# Patient Record
Sex: Female | Born: 1965 | Race: White | Hispanic: No | Marital: Married | Smoking: Never smoker
Health system: Southern US, Community
[De-identification: ages and names within clinical notes are randomized; demographics above are authoritative.]

## PROBLEM LIST (undated history)

## (undated) DIAGNOSIS — T753XXA Motion sickness, initial encounter: Secondary | ICD-10-CM

## (undated) DIAGNOSIS — C801 Malignant (primary) neoplasm, unspecified: Secondary | ICD-10-CM

## (undated) DIAGNOSIS — Z9109 Other allergy status, other than to drugs and biological substances: Secondary | ICD-10-CM

## (undated) DIAGNOSIS — Z973 Presence of spectacles and contact lenses: Secondary | ICD-10-CM

## (undated) HISTORY — PX: COLONOSCOPY: SHX174

---

## 2006-03-01 ENCOUNTER — Inpatient Hospital Stay (HOSPITAL_COMMUNITY): Admission: AD | Admit: 2006-03-01 | Discharge: 2006-03-01 | Payer: Self-pay | Admitting: Obstetrics & Gynecology

## 2006-03-01 ENCOUNTER — Ambulatory Visit: Payer: Self-pay | Admitting: Family Medicine

## 2006-10-22 ENCOUNTER — Ambulatory Visit: Payer: Self-pay | Admitting: Gynecology

## 2006-10-22 ENCOUNTER — Encounter (INDEPENDENT_AMBULATORY_CARE_PROVIDER_SITE_OTHER): Payer: Self-pay | Admitting: Gynecology

## 2007-10-28 ENCOUNTER — Ambulatory Visit: Payer: Self-pay | Admitting: Gynecology

## 2007-10-28 ENCOUNTER — Encounter: Payer: Self-pay | Admitting: Obstetrics & Gynecology

## 2007-10-28 ENCOUNTER — Encounter (INDEPENDENT_AMBULATORY_CARE_PROVIDER_SITE_OTHER): Payer: Self-pay | Admitting: Gynecology

## 2007-10-30 ENCOUNTER — Encounter: Admission: RE | Admit: 2007-10-30 | Discharge: 2007-10-30 | Payer: Self-pay | Admitting: Obstetrics & Gynecology

## 2008-11-04 ENCOUNTER — Ambulatory Visit: Payer: Self-pay | Admitting: Family Medicine

## 2008-11-04 ENCOUNTER — Encounter: Payer: Self-pay | Admitting: Family Medicine

## 2008-11-04 LAB — CONVERTED CEMR LAB

## 2009-11-09 ENCOUNTER — Ambulatory Visit: Payer: Self-pay | Admitting: Obstetrics & Gynecology

## 2009-11-09 LAB — CONVERTED CEMR LAB
HCV Ab: NEGATIVE
Hepatitis B Surface Ag: NEGATIVE

## 2010-01-21 ENCOUNTER — Ambulatory Visit: Payer: Self-pay | Admitting: Gastroenterology

## 2010-11-09 ENCOUNTER — Ambulatory Visit: Payer: Self-pay | Admitting: Obstetrics & Gynecology

## 2010-11-28 ENCOUNTER — Ambulatory Visit: Payer: Self-pay | Admitting: Obstetrics and Gynecology

## 2010-11-28 ENCOUNTER — Encounter: Payer: Self-pay | Admitting: Obstetrics and Gynecology

## 2010-11-28 LAB — CONVERTED CEMR LAB
Antibody Screen: NEGATIVE
Basophils Absolute: 0 10*3/uL (ref 0.0–0.1)
Basophils Relative: 0 % (ref 0–1)
Eosinophils Absolute: 0.1 10*3/uL (ref 0.0–0.7)
Hemoglobin: 14.3 g/dL (ref 12.0–15.0)
Hepatitis B Surface Ag: NEGATIVE
Neutro Abs: 4.6 10*3/uL (ref 1.7–7.7)
Neutrophils Relative %: 75 % (ref 43–77)
Rh Type: POSITIVE
Rubella: 35.9 intl units/mL — ABNORMAL HIGH

## 2010-12-06 ENCOUNTER — Ambulatory Visit (HOSPITAL_COMMUNITY)
Admission: RE | Admit: 2010-12-06 | Discharge: 2010-12-06 | Payer: Self-pay | Source: Home / Self Care | Attending: Family Medicine | Admitting: Family Medicine

## 2010-12-06 ENCOUNTER — Encounter: Payer: Self-pay | Admitting: Family Medicine

## 2010-12-13 ENCOUNTER — Encounter: Payer: Self-pay | Admitting: Family Medicine

## 2010-12-13 ENCOUNTER — Ambulatory Visit (HOSPITAL_COMMUNITY)
Admission: RE | Admit: 2010-12-13 | Discharge: 2010-12-13 | Payer: Self-pay | Source: Home / Self Care | Attending: Family Medicine | Admitting: Family Medicine

## 2010-12-20 ENCOUNTER — Encounter: Payer: Self-pay | Admitting: Family Medicine

## 2010-12-20 ENCOUNTER — Ambulatory Visit
Admission: RE | Admit: 2010-12-20 | Discharge: 2010-12-20 | Payer: Self-pay | Source: Home / Self Care | Attending: Family Medicine | Admitting: Family Medicine

## 2010-12-20 LAB — CONVERTED CEMR LAB: GC Probe Amp, Genital: NEGATIVE

## 2010-12-21 ENCOUNTER — Encounter: Payer: Self-pay | Admitting: Family Medicine

## 2010-12-21 LAB — CONVERTED CEMR LAB

## 2010-12-31 ENCOUNTER — Other Ambulatory Visit: Payer: Self-pay | Admitting: Family Medicine

## 2010-12-31 DIAGNOSIS — O09529 Supervision of elderly multigravida, unspecified trimester: Secondary | ICD-10-CM

## 2010-12-31 DIAGNOSIS — Z3689 Encounter for other specified antenatal screening: Secondary | ICD-10-CM

## 2011-01-09 ENCOUNTER — Ambulatory Visit (HOSPITAL_COMMUNITY)
Admission: RE | Admit: 2011-01-09 | Discharge: 2011-01-09 | Payer: Self-pay | Source: Home / Self Care | Attending: Family Medicine | Admitting: Family Medicine

## 2011-01-17 ENCOUNTER — Encounter: Payer: Self-pay | Admitting: Obstetrics and Gynecology

## 2011-01-17 DIAGNOSIS — Z3480 Encounter for supervision of other normal pregnancy, unspecified trimester: Secondary | ICD-10-CM

## 2011-02-07 ENCOUNTER — Ambulatory Visit (HOSPITAL_COMMUNITY)
Admission: RE | Admit: 2011-02-07 | Discharge: 2011-02-07 | Disposition: A | Discharge status: Home / Self Care | Payer: BC Managed Care – PPO | Source: Ambulatory Visit | Attending: Family Medicine | Admitting: Family Medicine

## 2011-02-07 DIAGNOSIS — Z1231 Encounter for screening mammogram for malignant neoplasm of breast: Secondary | ICD-10-CM | POA: Insufficient documentation

## 2011-02-15 ENCOUNTER — Encounter: Payer: BC Managed Care – PPO | Admitting: Obstetrics & Gynecology

## 2011-02-15 DIAGNOSIS — Z3480 Encounter for supervision of other normal pregnancy, unspecified trimester: Secondary | ICD-10-CM

## 2011-02-15 DIAGNOSIS — O09529 Supervision of elderly multigravida, unspecified trimester: Secondary | ICD-10-CM

## 2011-02-28 ENCOUNTER — Ambulatory Visit (HOSPITAL_COMMUNITY)
Admission: RE | Admit: 2011-02-28 | Discharge: 2011-02-28 | Disposition: A | Discharge status: Home / Self Care | Payer: BC Managed Care – PPO | Source: Ambulatory Visit | Attending: Family Medicine | Admitting: Family Medicine

## 2011-02-28 DIAGNOSIS — Z3689 Encounter for other specified antenatal screening: Secondary | ICD-10-CM

## 2011-02-28 DIAGNOSIS — O09529 Supervision of elderly multigravida, unspecified trimester: Secondary | ICD-10-CM

## 2011-02-28 DIAGNOSIS — Z1389 Encounter for screening for other disorder: Secondary | ICD-10-CM | POA: Insufficient documentation

## 2011-02-28 DIAGNOSIS — O358XX0 Maternal care for other (suspected) fetal abnormality and damage, not applicable or unspecified: Secondary | ICD-10-CM | POA: Insufficient documentation

## 2011-02-28 DIAGNOSIS — Z363 Encounter for antenatal screening for malformations: Secondary | ICD-10-CM | POA: Insufficient documentation

## 2011-03-01 ENCOUNTER — Other Ambulatory Visit: Payer: Self-pay | Admitting: Family Medicine

## 2011-03-01 DIAGNOSIS — Z0489 Encounter for examination and observation for other specified reasons: Secondary | ICD-10-CM

## 2011-03-14 ENCOUNTER — Encounter: Payer: BC Managed Care – PPO | Admitting: Obstetrics and Gynecology

## 2011-03-14 DIAGNOSIS — Z3480 Encounter for supervision of other normal pregnancy, unspecified trimester: Secondary | ICD-10-CM

## 2011-04-04 ENCOUNTER — Ambulatory Visit (HOSPITAL_COMMUNITY): Payer: BC Managed Care – PPO

## 2011-04-05 ENCOUNTER — Ambulatory Visit (HOSPITAL_COMMUNITY)
Admission: RE | Admit: 2011-04-05 | Discharge: 2011-04-05 | Disposition: A | Discharge status: Home / Self Care | Payer: BC Managed Care – PPO | Source: Ambulatory Visit | Attending: Family Medicine | Admitting: Family Medicine

## 2011-04-05 DIAGNOSIS — Z0489 Encounter for examination and observation for other specified reasons: Secondary | ICD-10-CM

## 2011-04-05 DIAGNOSIS — O09529 Supervision of elderly multigravida, unspecified trimester: Secondary | ICD-10-CM | POA: Insufficient documentation

## 2011-04-05 DIAGNOSIS — Z3689 Encounter for other specified antenatal screening: Secondary | ICD-10-CM | POA: Insufficient documentation

## 2011-04-12 ENCOUNTER — Encounter (INDEPENDENT_AMBULATORY_CARE_PROVIDER_SITE_OTHER): Payer: BC Managed Care – PPO | Admitting: Obstetrics & Gynecology

## 2011-04-12 DIAGNOSIS — O09529 Supervision of elderly multigravida, unspecified trimester: Secondary | ICD-10-CM

## 2011-04-12 DIAGNOSIS — Z3480 Encounter for supervision of other normal pregnancy, unspecified trimester: Secondary | ICD-10-CM

## 2011-04-25 ENCOUNTER — Inpatient Hospital Stay (HOSPITAL_COMMUNITY)
Admission: AD | Admit: 2011-04-25 | Discharge: 2011-04-25 | Disposition: A | Discharge status: Home / Self Care | Payer: BC Managed Care – PPO | Source: Ambulatory Visit | Attending: Obstetrics & Gynecology | Admitting: Obstetrics & Gynecology

## 2011-04-25 DIAGNOSIS — O212 Late vomiting of pregnancy: Secondary | ICD-10-CM

## 2011-04-25 DIAGNOSIS — K5289 Other specified noninfective gastroenteritis and colitis: Secondary | ICD-10-CM | POA: Insufficient documentation

## 2011-04-25 DIAGNOSIS — O99891 Other specified diseases and conditions complicating pregnancy: Secondary | ICD-10-CM

## 2011-04-25 DIAGNOSIS — O9989 Other specified diseases and conditions complicating pregnancy, childbirth and the puerperium: Secondary | ICD-10-CM

## 2011-04-25 LAB — URINALYSIS, ROUTINE W REFLEX MICROSCOPIC
Bilirubin Urine: NEGATIVE
Glucose, UA: NEGATIVE mg/dL
Ketones, ur: 15 mg/dL — AB
Leukocytes, UA: NEGATIVE
Nitrite: NEGATIVE
Urobilinogen, UA: 0.2 mg/dL (ref 0.0–1.0)
pH: 6 (ref 5.0–8.0)

## 2011-04-25 LAB — URINE MICROSCOPIC-ADD ON

## 2011-04-25 NOTE — Assessment & Plan Note (Signed)
NAME:  Rebecca Roman, Rebecca Roman NO.:  [id number]   MEDICAL RECORD NO.:  [id number]          PATIENT TYPE:  POB   LOCATION:  [facility] at [facility]         FACILITY:  [facility]   PHYSICIAN:  Tinnie Gens, MD        DATE OF BIRTH:  [birth year]   DATE OF SERVICE:  11/04/2008                                  CLINIC NOTE   CHIEF COMPLAINT:  Physical exam.   HISTORY OF PRESENT ILLNESS:  The patient is a 45 year old gravida 1,  para 1, who comes in today for annual exam.  She uses a Nuvaring.  She  has been using it sort of sporadically over the last year as she broke  up with her partner.  She did find out he had been seeing someone else  and would like a complete STD checked.  She had no significant vaginal  discharge, fevers, chills, nausea, or vomiting.   PAST MEDICAL HISTORY:  Seasonal allergies and history of kidney stones.   PAST SURGICAL HISTORY:  Negative.   MEDICATIONS:  She is on Flonase as needed, Nuvaring, Allegra D daily,  multivitamin, ibuprofen as needed, fish oil twice a day, and Singulair  daily.   ALLERGIES:  No known drug allergies.   FAMILY HISTORY:  Colon cancer in the paternal grandfather, who died at  33.  Father's sister died of leukemia.  There is a history of  hypertension.   SOCIAL HISTORY:  She is a social alcohol user.  No drugs or tobacco.  She works for [employer].   OBSTETRICAL HISTORY:  She is a G1, P1, and 1 vaginal delivery 13 years  ago.   GYNECOLOGICAL HISTORY:  Regular cycles with no difficulty.   REVIEW OF SYSTEMS:  A 14-point review of systems reviewed.  She denies  headache, chest pain, shortness of breath, nausea, vomiting, diarrhea,  constipation, dysuria, breast tenderness or abnormality.   PHYSICAL EXAMINATION:  GENERAL:  She is a well-developed, well-nourished  female in no acute distress.  VITAL SIGNS:  As noted in the chart, her weight is down to 133.  HEENT:  Normocephalic and atraumatic.  Sclerae anicteric.  NECK:   Supple.  Normal thyroid.  LUNGS:  Clear bilaterally.  CV:  Regular rate and rhythm. No rubs, gallops, or murmurs.  ABDOMEN:  Soft, nontender, and nondistended.  BREASTS:  Symmetric with everted nipples.  She has some fibrocystic  change in the outer lower quadrant bilaterally with no dominant masses.  No supraclavicular or axillary adenopathy.  EXTREMITIES:  No cyanosis, clubbing, or edema.  Distal pulses are 2+.  GU:  Normal external female genitalia.  BUS normal.  Vagina is pink and  rugated.  Cervix is without lesion.  Uterus looks small, anteverted.  No  adnexal mass or tenderness.   IMPRESSION:  1. Yearly exam  2. Sexually transmitted disease screen.   PLAN:  1. Pap smear today.  2. Schedule her mammogram today as well.  3. GC, chlamydia, RPR, and HIV today.  4. The patient had a health screen at work, show a total cholesterol      of 170, triglycerides of 72, and her HDL over 70s.  ______________________________  Tinnie Gens, MD     TP/MEDQ  D:  11/04/2008  T:  11/05/2008  Job:  [id number]

## 2011-04-25 NOTE — Assessment & Plan Note (Signed)
NAME:  Rebecca Roman, Rebecca Roman NO.:  [id number]   MEDICAL RECORD NO.:  [id number]          PATIENT TYPE:  POB   LOCATION:  [facility] at [facility]         FACILITY:  [facility]   PHYSICIAN:  Jaynie Collins, MD     DATE OF BIRTH:  [birth year]   DATE OF SERVICE:  11/09/2009                                  CLINIC NOTE   CHIEF COMPLAINT:  Annual examination.   HISTORY OF PRESENT ILLNESS:  The patient is a 45 year old gravida 1,  para 1 who comes in today for annual examination.  The patient denies  any gynecologic symptoms or any other systemic symptoms.  She is  scheduled for her annual mammogram later today.  The patient is also  desiring a complete FTI check.   PAST OB/GYN HISTORY:  Gravida 1, para 1, 1 vaginal delivery 14 years  ago.  She has regular cycles and no intermenstrual bleeding.  She denies  any history of abnormal Pap smears and her last Pap smear was on  November 04, 2008, which was negative.  She denies any history of  sexually transmitted infections.   PAST MEDICAL HISTORY:  Seasonal allergies and kidney stones.   PAST SURGICAL HISTORY:  None.   MEDICATIONS:  Flonase as needed, Allegra D daily, multivitamin as  needed, ibuprofen as needed, fish oil twice a day, and Singulair daily.   ALLERGIES:  No known drug allergies.   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  The patient lives with her daughter who is  currently a Printmaker in high school.  She is a social alcohol user.  No  illicit drugs or tobacco and denies any current or past history of  sexual or physical abuse.   FAMILY HISTORY:  Notable for colon cancer in the paternal grandfather  who died at age 38 and also her paternal aunt died of leukemia.  There  is a family history of hypertension.  No gynecologic cancers.   REVIEW OF SYSTEMS:  A 14 point review of systems was reviewed  and the  patient denied any other symptoms.   PHYSICAL EXAMINATION:  VITAL SIGNS:  Pulse 95, blood pressure 133/85,  weight 141 pounds.  GENERAL:  In no apparent distress.  HEENT:  Normocephalic and atraumatic.  NECK:  Supple.  Normal thyroid.  LUNGS:  Clear to auscultation bilaterally.  HEART:  Regular rate and rhythm.  BREASTS:  Symmetric with everted nipples.  No abnormal masses, skin  changes, lymphadenopathy, or nipple drainage noted.  ABDOMEN:  Soft, nontender, nondistended.  EXTREMITIES:  No cyanosis, clubbing, or edema.  PELVIC:  Normal external female genitalia.  Pink well rugated vagina.  Normal cervical contour.  The Pap smear was obtained.  Uterus is small  and anteverted.  No abnormal adnexal masses.  No tenderness on bimanual  examination.  Rectum is within normal limits on external examination.   ASSESSMENT AND PLAN:  The patient is a 45 year old gravida 1, para 1  here for annual examination.  The patient did have a normal breast  examination today.  We  will follow up on the results of her mammogram.  Pap smear was also done today.  We will run a reflex GC and chlamydia as  part of her STD screening and do serum testing for HIV, hepatitis B,  hepatitis C, RPR today.  The patient does have a history of doing a  health screen at work to check her lipid panel, which she said was  normal.  She was told to come back for any further gynecologic concerns.           ______________________________  Jaynie Collins, MD     UA/MEDQ  D:  11/09/2009  T:  11/10/2009  Job:  [id number]

## 2011-04-25 NOTE — Assessment & Plan Note (Signed)
NAME:  Rebecca, Roman NO.:  [id number]   MEDICAL RECORD NO.:  [id number]          PATIENT TYPE:  POB   LOCATION:  [facility] at [facility]         FACILITY:  [facility]   PHYSICIAN:  Allie Bossier, MD        DATE OF BIRTH:  [birth year]   DATE OF SERVICE:                                  CLINIC NOTE   Ms. Rebecca Roman is a 45 year old, divorced, white, gravida 1, para 101 with a  89 year old daughter who comes here for her annual exam.  She has no  complaints.  She is happy with Nuva Ring.   PAST MEDICAL HISTORY:  Seasonal allergies and history of kidney stone in  March 2007.   PAST SURGICAL HISTORY:  None.   FAMILY HISTORY:  Significant for colon cancer in a paternal grandfather  who died at 10 years of age.  There is no breast or GYN malignancies.   SOCIAL HISTORY:  Positive for social alcohol but she denies tobacco or  drug use.  No latex allergies.  No allergies to medicines.   REVIEW OF SYSTEMS:  Monogamous for more than 3 years.  No dyspareunia.  She works as an Scientist, water quality at [employer].   PHYSICAL EXAMINATION:  VITAL SIGNS:  Weight 141 pounds, blood pressure  133/87, pulse 87, height 5 feet 9.  HEENT:  Normal.  HEART:  Regular rate and rhythm.  LUNGS:  Clear to auscultation bilaterally.  ABDOMEN:  Benign.  BREASTS:  Normal.  EXTERNAL GENITALIA:  Shaved.  Normal.  No lesions.  Normal discharge.  Normal cervix.  Uterus normal size and shape, anteverted, mobile,  nontender.  Adnexa not enlarged.  No masses.   ASSESSMENT/PLAN:  Annual exam.  She currently has a refill on her Nuva  Ring.  I have given her information on Mirena.  Checked a Pap smear with  cervical cultures and recommended to schedule a mammogram.  Also  recommended self-breast exams, self vulvar exams monthly.  She has no  questions.      Allie Bossier, MD     MCD/MEDQ  D:  10/28/2007  T:  10/29/2007  Job:  [id number]

## 2011-04-25 NOTE — Assessment & Plan Note (Signed)
NAME:  Rebecca Roman, COVAL NO.:  [id number]   MEDICAL RECORD NO.:  [id number]          PATIENT TYPE:  POB   LOCATION:  [facility] at [facility]         FACILITY:  [facility]   PHYSICIAN:  Allie Bossier, MD        DATE OF BIRTH:  12-Oct-1966   DATE OF SERVICE:  11/09/2010                                  CLINIC NOTE   Ms. Rebecca Roman is a 45 year old divorced white G1, P1.  She has a 15-year-  old daughter.  She comes in here for annual exam.  She has no particular  complaints.  She does mention during a review of systems that she has  been having unprotected intercourse.  She has been with her current  partner for the last year and a half.  He has no children and she would  like to conceive.  She does have a period once a month and thinks she is  ovulating.   PAST MEDICAL HISTORY:  Seasonal allergies, history of kidney stone in  March 2007.   PAST SURGICAL HISTORY:  None.   MEDICATIONS:  She takes Singulair daily, fish oil, Allegra-D, and  Flonase on a p.r.n. basis.   SOCIAL HISTORY:  She drinks alcohol socially.  Denies tobacco or drug  use.   REVIEW OF SYSTEMS:  She denies dyspareunia.  She [facility] to the [facility] for her regular medical care.  Her mammogram is today.  She had a  colonoscopy in February of this year and it was normal except that she  was told she has internal hemorrhoids.  The remainder of review of  systems questions are negative.   FAMILY HISTORY:  Positive for colon cancer in a paternal grandfather who  died at age 79.  She denies family history of GYN or breast cancer.   PHYSICAL EXAMINATION:  GENERAL:  Well-nourished, well-hydrated pleasant  white female, height 5 feet 9 inches, weight 146 pounds, blood pressure  120/80, pulse 81.  HEENT:  Normal.  BREASTS:  Normal bilaterally.  HEART:  Regular rate and rhythm.  LUNGS:  Clear to auscultation bilaterally.  ABDOMEN:  Scaphoid, benign.  No palpable hepatosplenomegaly.  EXTERNAL GENITALIA:  No  lesions, shaved.  Cervix parous.  Normal  discharge.  Uterus, normal size and shape.  Anteverted mobile.  Adnexa  nontender.  No masses.   ASSESSMENT AND PLAN:  Annual exam.  I have checked the Pap smear.  Recommended self-breast and self-vulvar exams.  Her mammogram will be  later today.  With regard to her desire for conception, I have recommend  she start on multivitamin with folic acid daily.  I have also told her  that should she wish to explore why she has not  conceived yet, then her boyfriend will need to the urologist for semen  analysis and she should start ovulation prediction kit.      Allie Bossier, MD     MCD/MEDQ  D:  11/09/2010  T:  11/09/2010  Job:  [id number]

## 2011-05-02 ENCOUNTER — Encounter (INDEPENDENT_AMBULATORY_CARE_PROVIDER_SITE_OTHER): Payer: BC Managed Care – PPO | Admitting: Obstetrics & Gynecology

## 2011-05-02 DIAGNOSIS — O09529 Supervision of elderly multigravida, unspecified trimester: Secondary | ICD-10-CM

## 2011-05-02 DIAGNOSIS — Z3480 Encounter for supervision of other normal pregnancy, unspecified trimester: Secondary | ICD-10-CM

## 2011-05-05 ENCOUNTER — Other Ambulatory Visit: Payer: BC Managed Care – PPO

## 2011-05-24 ENCOUNTER — Encounter (INDEPENDENT_AMBULATORY_CARE_PROVIDER_SITE_OTHER): Payer: BC Managed Care – PPO | Admitting: Obstetrics and Gynecology

## 2011-05-24 DIAGNOSIS — Z3480 Encounter for supervision of other normal pregnancy, unspecified trimester: Secondary | ICD-10-CM

## 2011-06-07 ENCOUNTER — Encounter (INDEPENDENT_AMBULATORY_CARE_PROVIDER_SITE_OTHER): Payer: BC Managed Care – PPO | Admitting: Obstetrics & Gynecology

## 2011-06-07 DIAGNOSIS — Z3480 Encounter for supervision of other normal pregnancy, unspecified trimester: Secondary | ICD-10-CM

## 2011-06-07 DIAGNOSIS — O09529 Supervision of elderly multigravida, unspecified trimester: Secondary | ICD-10-CM

## 2011-06-20 ENCOUNTER — Encounter: Payer: Self-pay | Admitting: Obstetrics & Gynecology

## 2011-06-20 ENCOUNTER — Ambulatory Visit (INDEPENDENT_AMBULATORY_CARE_PROVIDER_SITE_OTHER): Payer: BC Managed Care – PPO | Admitting: Obstetrics & Gynecology

## 2011-06-20 VITALS — BP 135/83 | Wt 183.0 lb

## 2011-06-20 DIAGNOSIS — O09529 Supervision of elderly multigravida, unspecified trimester: Secondary | ICD-10-CM

## 2011-06-20 DIAGNOSIS — Z3480 Encounter for supervision of other normal pregnancy, unspecified trimester: Secondary | ICD-10-CM

## 2011-06-29 ENCOUNTER — Other Ambulatory Visit: Payer: Self-pay | Admitting: Obstetrics & Gynecology

## 2011-06-29 ENCOUNTER — Encounter (INDEPENDENT_AMBULATORY_CARE_PROVIDER_SITE_OTHER): Payer: BC Managed Care – PPO | Admitting: Obstetrics & Gynecology

## 2011-06-29 DIAGNOSIS — Z3480 Encounter for supervision of other normal pregnancy, unspecified trimester: Secondary | ICD-10-CM

## 2011-06-30 LAB — GC/CHLAMYDIA PROBE AMP, GENITAL: Chlamydia, DNA Probe: NEGATIVE

## 2011-07-13 ENCOUNTER — Encounter (INDEPENDENT_AMBULATORY_CARE_PROVIDER_SITE_OTHER): Payer: BC Managed Care – PPO | Admitting: Obstetrics & Gynecology

## 2011-07-13 DIAGNOSIS — O321XX0 Maternal care for breech presentation, not applicable or unspecified: Secondary | ICD-10-CM

## 2011-07-13 DIAGNOSIS — Z3480 Encounter for supervision of other normal pregnancy, unspecified trimester: Secondary | ICD-10-CM

## 2011-07-13 DIAGNOSIS — O09529 Supervision of elderly multigravida, unspecified trimester: Secondary | ICD-10-CM

## 2011-07-18 ENCOUNTER — Encounter (HOSPITAL_COMMUNITY): Payer: Self-pay

## 2011-07-18 ENCOUNTER — Observation Stay (HOSPITAL_COMMUNITY)
Admission: RE | Admit: 2011-07-18 | Discharge: 2011-07-18 | Disposition: A | Discharge status: Home / Self Care | Payer: BC Managed Care – PPO | Source: Ambulatory Visit | Attending: Family Medicine | Admitting: Family Medicine

## 2011-07-18 DIAGNOSIS — O321XX0 Maternal care for breech presentation, not applicable or unspecified: Principal | ICD-10-CM | POA: Insufficient documentation

## 2011-07-18 HISTORY — DX: Other allergy status, other than to drugs and biological substances: Z91.09

## 2011-07-18 MED ORDER — TERBUTALINE SULFATE 1 MG/ML IJ SOLN
0.2500 mg | Freq: Once | INTRAMUSCULAR | Status: AC
  Administered 2011-07-18: 0.25 mg via SUBCUTANEOUS

## 2011-07-18 NOTE — H&P (Signed)
Rebecca Roman is a 45 y.o. female presenting for breech and external version. Maternal Medical History:  Reason for admission: Reason for Admission:   nauseaFetal activity: Perceived fetal activity is normal.    Prenatal complications: HIV.   No bleeding.     OB History    Grav Para Term Preterm Abortions TAB SAB Ect Mult Living   2 1 1       1      Past Medical History  Diagnosis Date  . Environmental allergies    Past Surgical History  Procedure Date  . No past surgeries    Family History: family history is not on file. Social History:  reports that she has never smoked. She has never used smokeless tobacco. She reports that she does not drink alcohol or use illicit drugs.  Review of Systems  Constitutional: Negative for fever and chills.  Respiratory: Negative for cough, shortness of breath and wheezing.   Cardiovascular: Negative for chest pain.  Gastrointestinal: Negative for nausea, vomiting and abdominal pain.  Genitourinary: Negative for dysuria.  Musculoskeletal: Negative for myalgias and back pain.  Neurological: Negative for dizziness and tingling.  Psychiatric/Behavioral: Negative for depression.      Last menstrual period 10/22/2010. Maternal Exam:  Uterine Assessment: Contraction strength is mild.  Contraction frequency is irregular.   Abdomen: Patient reports no abdominal tenderness. Fundal height is size equals dates.   Fetal presentation: breech  Introitus: Normal vulva.   Physical Exam  Constitutional: She is oriented to person, place, and time. She appears well-developed and well-nourished.  HENT:  Head: Normocephalic and atraumatic.  Eyes: Pupils are equal, round, and reactive to light.  Neck: Normal range of motion.  Cardiovascular: Normal rate.   Respiratory: Effort normal.  GI: Soft.       gravid  Musculoskeletal: Normal range of motion.  Neurological: She is alert and oriented to person, place, and time.  Skin: Skin is warm and dry.    Psychiatric: She has a normal mood and affect.    Prenatal labs: ABO, Rh:  O pos Antibody: NEG (12/19 2149) Rubella:  Immune RPR: NON REAC (12/19 2149)  HBsAg: NEGATIVE (12/19 2149)  HIV: NON REACTIVE (12/19 2149)  GBS: NEGATIVE (07/19 1151)   Assessment/Plan: Breech presentaion For ECV Risks, benefits discussed. Thayer Inabinet S 07/18/2011, 9:03 AM

## 2011-07-18 NOTE — Procedures (Signed)
After informed verbal consent, Terbutaline 0.25 mg SQ given, ECV was attempted under Ultrasound guidance x 3.  Forward roll x 2 and backward roll x 1.  There was a small amount of movement, but ECV was unsuccessful. FHR was reactive before and after the procedure.   Pt. Tolerated the procedure well.

## 2011-07-19 ENCOUNTER — Encounter (INDEPENDENT_AMBULATORY_CARE_PROVIDER_SITE_OTHER): Payer: BC Managed Care – PPO | Admitting: Family Medicine

## 2011-07-19 DIAGNOSIS — Z3480 Encounter for supervision of other normal pregnancy, unspecified trimester: Secondary | ICD-10-CM

## 2011-07-19 NOTE — Discharge Summary (Signed)
Obstetric Discharge Summary Reason for Admission: breech Prenatal Procedures: ECV Intrapartum Procedures: none Postpartum Procedures: none Complications-Operative and Postpartum: none Hemoglobin  Date Value Range Status  11/28/2010 14.3  12.0-15.0 (g/dL) Final     HCT  Date Value Range Status  11/28/2010 44.2  36.0-46.0 (%) Final    Discharge Diagnoses: Breech pregnancy-failed version  Discharge Information: Date: 07/19/2011 Activity: unrestricted Diet: routine Medications: resume home meds Condition: stable Instructions: accupuncture, music, lights, position to try to get baby to turn to head down. Discharge to: home Follow-up Information    Follow up with Oceanna Arruda S, MD. Call in 1 day. (Keep your regularly scheduled appointment)    Contact information:   [address] [place] [address] [phone]           Ruchy Wildrick S 07/19/2011, 8:42 AM

## 2011-07-25 ENCOUNTER — Encounter (INDEPENDENT_AMBULATORY_CARE_PROVIDER_SITE_OTHER): Payer: BC Managed Care – PPO | Admitting: Obstetrics and Gynecology

## 2011-07-25 DIAGNOSIS — O09529 Supervision of elderly multigravida, unspecified trimester: Secondary | ICD-10-CM

## 2011-07-25 DIAGNOSIS — Z3480 Encounter for supervision of other normal pregnancy, unspecified trimester: Secondary | ICD-10-CM

## 2011-07-26 ENCOUNTER — Encounter (HOSPITAL_COMMUNITY): Payer: Self-pay

## 2011-07-26 ENCOUNTER — Telehealth: Payer: Self-pay

## 2011-07-26 ENCOUNTER — Encounter (HOSPITAL_COMMUNITY)
Admission: RE | Admit: 2011-07-26 | Discharge: 2011-07-26 | Disposition: A | Discharge status: Home / Self Care | Payer: BC Managed Care – PPO | Source: Ambulatory Visit | Attending: Obstetrics and Gynecology | Admitting: Obstetrics and Gynecology

## 2011-07-26 LAB — CBC
Hemoglobin: 12.6 g/dL (ref 12.0–15.0)
MCV: 95 fL (ref 78.0–100.0)
Platelets: 212 10*3/uL (ref 150–400)
RBC: 4.03 MIL/uL (ref 3.87–5.11)
WBC: 10.7 10*3/uL — ABNORMAL HIGH (ref 4.0–10.5)

## 2011-07-26 LAB — SURGICAL PCR SCREEN: Staphylococcus aureus: POSITIVE — AB

## 2011-07-26 LAB — RPR: RPR Ser Ql: NONREACTIVE

## 2011-07-26 NOTE — Patient Instructions (Signed)
20 Rebecca Roman  07/26/2011   Your procedure is scheduled on:  07/27/11  Report to [facility] at 1245 PM.  Call this number if you have problems the morning of surgery: [phone]   Remember:   Do not eat food:4 Hours before arrival.  Do not drink clear liquids: after 1015 am Thurs  Take these medicines the morning of surgery with A SIP OF WATER: none   Do not wear jewelry, make-up or nail polish.  Do not wear lotions, powders, or perfumes. You may wear deodorant.  Do not shave 48 hours prior to surgery.  Do not bring valuables to the hospital.  Contacts, dentures or bridgework may not be worn into surgery.  Leave suitcase in the car. After surgery it may be brought to your room.  For patients admitted to the hospital, checkout time is 11:00 AM the day of discharge.   Patients discharged the day of surgery will not be allowed to drive home.  Name and phone number of your driver: Caryn Bee- [phone]  Special Instructions: CHG Shower Use Special Wash: 1/2 bottle night before surgery and 1/2 bottle morning of surgery.   Please read over the following fact sheets that you were given:none

## 2011-07-26 NOTE — Telephone Encounter (Signed)
Hi Dr. Jolayne Panther,  I need you to call me as soon as you have a chance regarding this patient Rebecca Roman. You will be performing her C-section tomorrow afternoon and patient came in to our office today after her pre-op very upset b/c they told her she will not be able to have one of her daughters present in the delivery room and she is not happy about that. She was told to confirm with you if that would be able to happen. Patient is awaiting my call to let her know, she left pretty upset and needed to know that she would be able to have her daughter there. Please call me at [phone] that is my cell since I'm about to leave and I did tell patient I would call her today. Sorry to bother you about this but I didn't;t know what else to do, thanks!

## 2011-07-27 ENCOUNTER — Inpatient Hospital Stay (HOSPITAL_COMMUNITY): Payer: BC Managed Care – PPO | Admitting: Anesthesiology

## 2011-07-27 ENCOUNTER — Encounter (HOSPITAL_COMMUNITY): Payer: Self-pay | Admitting: *Deleted

## 2011-07-27 ENCOUNTER — Encounter (HOSPITAL_COMMUNITY)
Admission: RE | Disposition: A | Discharge status: Home / Self Care | Payer: Self-pay | Source: Ambulatory Visit | Attending: Obstetrics and Gynecology

## 2011-07-27 ENCOUNTER — Encounter (HOSPITAL_COMMUNITY): Payer: Self-pay | Admitting: Anesthesiology

## 2011-07-27 ENCOUNTER — Inpatient Hospital Stay (HOSPITAL_COMMUNITY)
Admission: RE | Admit: 2011-07-27 | Discharge: 2011-07-29 | DRG: 371 | Disposition: A | Discharge status: Home / Self Care | Payer: BC Managed Care – PPO | Source: Ambulatory Visit | Attending: Obstetrics and Gynecology | Admitting: Obstetrics and Gynecology

## 2011-07-27 DIAGNOSIS — Z3480 Encounter for supervision of other normal pregnancy, unspecified trimester: Secondary | ICD-10-CM

## 2011-07-27 DIAGNOSIS — Z01818 Encounter for other preprocedural examination: Secondary | ICD-10-CM

## 2011-07-27 DIAGNOSIS — O09529 Supervision of elderly multigravida, unspecified trimester: Secondary | ICD-10-CM

## 2011-07-27 DIAGNOSIS — O321XX0 Maternal care for breech presentation, not applicable or unspecified: Secondary | ICD-10-CM

## 2011-07-27 DIAGNOSIS — Z01812 Encounter for preprocedural laboratory examination: Secondary | ICD-10-CM

## 2011-07-27 LAB — TYPE AND SCREEN
ABO/RH(D): O POS
Antibody Screen: NEGATIVE

## 2011-07-27 SURGERY — Surgical Case
Anesthesia: Regional | Site: Uterus | Wound class: Clean Contaminated

## 2011-07-27 MED ORDER — ONDANSETRON HCL 4 MG/2ML IJ SOLN
INTRAMUSCULAR | Status: DC | PRN
  Administered 2011-07-27: 4 mg via INTRAVENOUS

## 2011-07-27 MED ORDER — OXYTOCIN 20 UNITS IN LACTATED RINGERS INFUSION - SIMPLE
INTRAVENOUS | Status: AC
  Administered 2011-07-27: 125 mL/h via INTRAVENOUS
  Filled 2011-07-27: qty 1000

## 2011-07-27 MED ORDER — FENTANYL CITRATE 0.05 MG/ML IJ SOLN: 25.0000 ug | INTRAMUSCULAR | Status: DC | PRN

## 2011-07-27 MED ORDER — SIMETHICONE 80 MG PO CHEW
80.0000 mg | CHEWABLE_TABLET | ORAL | Status: DC | PRN
  Administered 2011-07-28: 80 mg via ORAL

## 2011-07-27 MED ORDER — DIPHENHYDRAMINE HCL 25 MG PO CAPS: 25.0000 mg | ORAL_CAPSULE | Freq: Four times a day (QID) | ORAL | Status: DC | PRN

## 2011-07-27 MED ORDER — KETOROLAC TROMETHAMINE 30 MG/ML IJ SOLN
30.0000 mg | Freq: Four times a day (QID) | INTRAMUSCULAR | Status: AC | PRN
  Administered 2011-07-27: 30 mg via INTRAVENOUS
  Filled 2011-07-27 (×2): qty 1

## 2011-07-27 MED ORDER — BUPIVACAINE IN DEXTROSE 0.75-8.25 % IT SOLN
INTRATHECAL | Status: DC | PRN
  Administered 2011-07-27: 2 mL via INTRATHECAL

## 2011-07-27 MED ORDER — MUPIROCIN 2 % EX OINT
TOPICAL_OINTMENT | Freq: Two times a day (BID) | CUTANEOUS | Status: DC
  Administered 2011-07-27 – 2011-07-29 (×3): via NASAL

## 2011-07-27 MED ORDER — FERROUS SULFATE 325 (65 FE) MG PO TABS
325.0000 mg | ORAL_TABLET | Freq: Two times a day (BID) | ORAL | Status: DC
  Administered 2011-07-28 – 2011-07-29 (×3): 325 mg via ORAL
  Filled 2011-07-27 (×3): qty 1

## 2011-07-27 MED ORDER — SCOPOLAMINE 1 MG/3DAYS TD PT72
MEDICATED_PATCH | TRANSDERMAL | Status: AC
  Administered 2011-07-27: 1.5 mg
  Filled 2011-07-27: qty 1

## 2011-07-27 MED ORDER — MENTHOL 3 MG MT LOZG: 1.0000 | LOZENGE | OROMUCOSAL | Status: DC | PRN

## 2011-07-27 MED ORDER — NALBUPHINE HCL 10 MG/ML IJ SOLN: 5.0000 mg | INTRAMUSCULAR | Status: DC | PRN

## 2011-07-27 MED ORDER — METOCLOPRAMIDE HCL 5 MG/ML IJ SOLN: 10.0000 mg | Freq: Three times a day (TID) | INTRAMUSCULAR | Status: DC | PRN

## 2011-07-27 MED ORDER — IBUPROFEN 600 MG PO TABS
600.0000 mg | ORAL_TABLET | Freq: Four times a day (QID) | ORAL | Status: DC
  Administered 2011-07-28 – 2011-07-29 (×7): 600 mg via ORAL
  Filled 2011-07-27 (×7): qty 1

## 2011-07-27 MED ORDER — LACTATED RINGERS IV SOLN
INTRAVENOUS | Status: DC
  Administered 2011-07-27: 1000 mL via INTRAVENOUS
  Administered 2011-07-27 (×2): via INTRAVENOUS

## 2011-07-27 MED ORDER — MONTELUKAST SODIUM 10 MG PO TABS
10.0000 mg | ORAL_TABLET | Freq: Every day | ORAL | Status: DC
  Administered 2011-07-28 – 2011-07-29 (×2): 10 mg via ORAL
  Filled 2011-07-27 (×4): qty 1

## 2011-07-27 MED ORDER — NALOXONE HCL 0.4 MG/ML IJ SOLN: 0.4000 mg | INTRAMUSCULAR | Status: DC | PRN

## 2011-07-27 MED ORDER — LANOLIN HYDROUS EX OINT: 1.0000 "application " | TOPICAL_OINTMENT | CUTANEOUS | Status: DC | PRN

## 2011-07-27 MED ORDER — SCOPOLAMINE 1 MG/3DAYS TD PT72: 1.0000 | MEDICATED_PATCH | Freq: Once | TRANSDERMAL | Status: DC

## 2011-07-27 MED ORDER — MORPHINE SULFATE (PF) 0.5 MG/ML IJ SOLN
INTRAMUSCULAR | Status: DC | PRN
  Administered 2011-07-27: .15 mg via INTRATHECAL

## 2011-07-27 MED ORDER — OXYCODONE-ACETAMINOPHEN 5-325 MG PO TABS
1.0000 | ORAL_TABLET | ORAL | Status: DC | PRN
  Administered 2011-07-29 (×2): 1 via ORAL
  Filled 2011-07-27 (×3): qty 2

## 2011-07-27 MED ORDER — TETANUS-DIPHTH-ACELL PERTUSSIS 5-2.5-18.5 LF-MCG/0.5 IM SUSP
0.5000 mL | Freq: Once | INTRAMUSCULAR | Status: AC
  Administered 2011-07-29: 0.5 mL via INTRAMUSCULAR
  Filled 2011-07-27: qty 0.5

## 2011-07-27 MED ORDER — DIPHENHYDRAMINE HCL 50 MG/ML IJ SOLN: 12.5000 mg | INTRAMUSCULAR | Status: DC | PRN

## 2011-07-27 MED ORDER — DIBUCAINE 1 % RE OINT: 1.0000 "application " | TOPICAL_OINTMENT | RECTAL | Status: DC | PRN

## 2011-07-27 MED ORDER — LACTATED RINGERS IV SOLN
INTRAVENOUS | Status: DC
  Administered 2011-07-27 – 2011-07-28 (×2): via INTRAVENOUS

## 2011-07-27 MED ORDER — ONDANSETRON HCL 4 MG/2ML IJ SOLN: 4.0000 mg | Freq: Three times a day (TID) | INTRAMUSCULAR | Status: DC | PRN

## 2011-07-27 MED ORDER — FLUTICASONE PROPIONATE 50 MCG/ACT NA SUSP
2.0000 | Freq: Every day | NASAL | Status: DC
  Administered 2011-07-28 – 2011-07-29 (×2): 2 via NASAL
  Filled 2011-07-27 (×4): qty 2

## 2011-07-27 MED ORDER — KETOROLAC TROMETHAMINE 60 MG/2ML IM SOLN: 60.0000 mg | Freq: Once | INTRAMUSCULAR | Status: AC | PRN

## 2011-07-27 MED ORDER — ONDANSETRON HCL 4 MG PO TABS: 4.0000 mg | ORAL_TABLET | ORAL | Status: DC | PRN

## 2011-07-27 MED ORDER — DIPHENHYDRAMINE HCL 50 MG/ML IJ SOLN: 25.0000 mg | INTRAMUSCULAR | Status: DC | PRN

## 2011-07-27 MED ORDER — IBUPROFEN 600 MG PO TABS: 600.0000 mg | ORAL_TABLET | Freq: Four times a day (QID) | ORAL | Status: DC | PRN

## 2011-07-27 MED ORDER — SODIUM CHLORIDE 0.9 % IJ SOLN: 3.0000 mL | INTRAMUSCULAR | Status: DC | PRN

## 2011-07-27 MED ORDER — OXYTOCIN 20 UNITS IN LACTATED RINGERS INFUSION - SIMPLE
125.0000 mL/h | INTRAVENOUS | Status: AC
  Administered 2011-07-27: 125 mL/h via INTRAVENOUS
  Filled 2011-07-27: qty 1000

## 2011-07-27 MED ORDER — KETOROLAC TROMETHAMINE 30 MG/ML IJ SOLN: 30.0000 mg | Freq: Four times a day (QID) | INTRAMUSCULAR | Status: AC | PRN

## 2011-07-27 MED ORDER — OXYTOCIN 10 UNIT/ML IJ SOLN
INTRAMUSCULAR | Status: AC
  Filled 2011-07-27: qty 2

## 2011-07-27 MED ORDER — ZOLPIDEM TARTRATE 5 MG PO TABS: 5.0000 mg | ORAL_TABLET | Freq: Every evening | ORAL | Status: DC | PRN

## 2011-07-27 MED ORDER — SIMETHICONE 80 MG PO CHEW
80.0000 mg | CHEWABLE_TABLET | Freq: Three times a day (TID) | ORAL | Status: DC
  Administered 2011-07-27 – 2011-07-29 (×5): 80 mg via ORAL

## 2011-07-27 MED ORDER — CEFAZOLIN SODIUM 1-5 GM-% IV SOLN: 1.0000 g | INTRAVENOUS | Status: DC

## 2011-07-27 MED ORDER — SENNOSIDES-DOCUSATE SODIUM 8.6-50 MG PO TABS
2.0000 | ORAL_TABLET | Freq: Every day | ORAL | Status: DC
  Administered 2011-07-27 – 2011-07-28 (×2): 2 via ORAL

## 2011-07-27 MED ORDER — WITCH HAZEL-GLYCERIN EX PADS: 1.0000 "application " | MEDICATED_PAD | CUTANEOUS | Status: DC | PRN

## 2011-07-27 MED ORDER — MUPIROCIN 2 % EX OINT
TOPICAL_OINTMENT | CUTANEOUS | Status: AC
  Filled 2011-07-27: qty 22

## 2011-07-27 MED ORDER — CEFAZOLIN SODIUM 1-5 GM-% IV SOLN
INTRAVENOUS | Status: DC | PRN
  Administered 2011-07-27: 1 g via INTRAVENOUS

## 2011-07-27 MED ORDER — MEPERIDINE HCL 25 MG/ML IJ SOLN: 6.2500 mg | INTRAMUSCULAR | Status: DC | PRN

## 2011-07-27 MED ORDER — OXYTOCIN 10 UNIT/ML IJ SOLN
INTRAMUSCULAR | Status: DC | PRN
  Administered 2011-07-27: 20 [IU] via INTRAMUSCULAR

## 2011-07-27 MED ORDER — DIPHENHYDRAMINE HCL 25 MG PO CAPS: 25.0000 mg | ORAL_CAPSULE | ORAL | Status: DC | PRN

## 2011-07-27 MED ORDER — CEFAZOLIN SODIUM 1-5 GM-% IV SOLN
INTRAVENOUS | Status: AC
  Filled 2011-07-27: qty 50

## 2011-07-27 MED ORDER — SODIUM CHLORIDE 0.9 % IV SOLN
1.0000 ug/kg/h | INTRAVENOUS | Status: DC | PRN
  Filled 2011-07-27: qty 2.5

## 2011-07-27 MED ORDER — ONDANSETRON HCL 4 MG/2ML IJ SOLN: 4.0000 mg | INTRAMUSCULAR | Status: DC | PRN

## 2011-07-27 MED ORDER — FENTANYL CITRATE 0.05 MG/ML IJ SOLN
INTRAMUSCULAR | Status: DC | PRN
  Administered 2011-07-27: 25 ug via INTRATHECAL

## 2011-07-27 MED ORDER — FENTANYL CITRATE 0.05 MG/ML IJ SOLN
INTRAMUSCULAR | Status: AC
  Filled 2011-07-27: qty 2

## 2011-07-27 MED ORDER — MORPHINE SULFATE 0.5 MG/ML IJ SOLN
INTRAMUSCULAR | Status: AC
  Filled 2011-07-27: qty 10

## 2011-07-27 MED ORDER — PRENATAL PLUS 27-1 MG PO TABS
1.0000 | ORAL_TABLET | Freq: Every day | ORAL | Status: DC
  Administered 2011-07-28 – 2011-07-29 (×2): 1 via ORAL
  Filled 2011-07-27 (×2): qty 1

## 2011-07-27 SURGICAL SUPPLY — 26 items
CHLORAPREP W/TINT 26ML (MISCELLANEOUS) ×2 IMPLANT
CONTAINER PREFILL 10% NBF 15ML (MISCELLANEOUS) IMPLANT
DRESSING TELFA 8X3 (GAUZE/BANDAGES/DRESSINGS) IMPLANT
DRSG COVADERM 4X8 (GAUZE/BANDAGES/DRESSINGS) ×2 IMPLANT
ELECT REM PT RETURN 9FT ADLT (ELECTROSURGICAL) ×2
ELECTRODE REM PT RTRN 9FT ADLT (ELECTROSURGICAL) ×1 IMPLANT
EXTRACTOR VACUUM M CUP 4 TUBE (SUCTIONS) IMPLANT
GAUZE SPONGE 4X4 12PLY STRL LF (GAUZE/BANDAGES/DRESSINGS) IMPLANT
GLOVE BIOGEL PI IND STRL 6.5 (GLOVE) ×2 IMPLANT
GLOVE BIOGEL PI INDICATOR 6.5 (GLOVE) ×2
GLOVE SURG SS PI 6.0 STRL IVOR (GLOVE) ×2 IMPLANT
GOWN PREVENTION PLUS LG XLONG (DISPOSABLE) ×6 IMPLANT
KIT ABG SYR 3ML LUER SLIP (SYRINGE) IMPLANT
NEEDLE HYPO 25X5/8 SAFETYGLIDE (NEEDLE) IMPLANT
NS IRRIG 1000ML POUR BTL (IV SOLUTION) ×2 IMPLANT
PACK C SECTION WH (CUSTOM PROCEDURE TRAY) ×2 IMPLANT
PAD ABD 7.5X8 STRL (GAUZE/BANDAGES/DRESSINGS) IMPLANT
RTRCTR C-SECT PINK 25CM LRG (MISCELLANEOUS) IMPLANT
SEPRAFILM MEMBRANE 5X6 (MISCELLANEOUS) IMPLANT
SLEEVE SCD COMPRESS KNEE MED (MISCELLANEOUS) IMPLANT
STAPLER VISISTAT 35W (STAPLE) ×2 IMPLANT
SUT PLAIN 0 NONE (SUTURE) IMPLANT
SUT VIC AB 0 CT1 36 (SUTURE) ×8 IMPLANT
TOWEL OR 17X24 6PK STRL BLUE (TOWEL DISPOSABLE) ×4 IMPLANT
TRAY FOLEY CATH 14FR (SET/KITS/TRAYS/PACK) ×2 IMPLANT
WATER STERILE IRR 1000ML POUR (IV SOLUTION) ×2 IMPLANT

## 2011-07-27 NOTE — Anesthesia Preprocedure Evaluation (Signed)
Anesthesia Evaluation  Name, MR# and DOB Patient awake  General Assessment Comment  Reviewed: Allergy & Precautions, H&P , NPO status , Patient's Chart, lab work & pertinent test results  Airway Mallampati: III TM Distance: >3 FB Neck ROM: Full    Dental No notable dental hx. (+) Teeth Intact   Pulmonary    pulmonary exam normalPulmonary Exam Normal     Cardiovascular Regular Normal    Neuro/Psych Negative Neurological ROS  Negative Psych ROS  GI/Hepatic/Renal negative GI ROS, negative Liver ROS, and negative Renal ROS (+)  GERD Controlled and Medicated     Endo/Other  Negative Endocrine ROS (+)      Abdominal Normal abdominal exam  (+)   Musculoskeletal negative musculoskeletal ROS (+)   Hematology negative hematology ROS (+)   Peds  Reproductive/Obstetrics (+) Pregnancy    Anesthesia Other Findings             Anesthesia Physical Anesthesia Plan  ASA: II  Anesthesia Plan: Spinal   Post-op Pain Management:    Induction:   Airway Management Planned:   Additional Equipment:   Intra-op Plan:   Post-operative Plan:   Informed Consent: I have reviewed the patients History and Physical, chart, labs and discussed the procedure including the risks, benefits and alternatives for the proposed anesthesia with the patient or authorized representative who has indicated his/her understanding and acceptance.     Plan Discussed with: Anesthesiologist  Anesthesia Plan Comments:         Anesthesia Quick Evaluation

## 2011-07-27 NOTE — H&P (Signed)
Rebecca Roman is a 45 y.o. female G2P1001 with IUP at [redacted]w[redacted]d presenting for primary c/s for breech presentation. Pt states she has been having no contractions, associated with none vaginal bleeding, intact, with active.  She desires to NuvaRing vaginal inserts.  [facility] at [facility] 8.3 wks  Prenatal History/Complications: AMA Nephrolithiasis  Past Medical History: Past Medical History  Diagnosis Date  . Environmental allergies   . GERD (gastroesophageal reflux disease)     heartburn with pregnancy    Past Surgical History: Past Surgical History  Procedure Date  . No past surgeries     Obstetrical History: OB History    Grav Para Term Preterm Abortions TAB SAB Ect Mult Living   2 1 1       1       Gynecological History: No STIs or HSV  Social History: History   Social History  . Marital Status: Single    Spouse Name: N/A    Number of Children: N/A  . Years of Education: N/A   Social History Main Topics  . Smoking status: Never Smoker   . Smokeless tobacco: Never Used  . Alcohol Use: No  . Drug Use: No  . Sexually Active: Yes   Other Topics Concern  . Not on file   Social History Narrative  . No narrative on file    Family History: No family history on file.  Allergies: Allergies  Allergen Reactions  . Scallops (Shellfish Allergy)     Only scallops, not any other seafood    Prescriptions prior to admission  Medication Sig Dispense Refill  . Docosahexaenoic Acid (DHA COMPLETE PO) Take by mouth daily.        . fexofenadine-pseudoephedrine (ALLEGRA-D) 60-120 MG per tablet Take 1 tablet by mouth daily.        . fluticasone (FLONASE) 50 MCG/ACT nasal spray Place 2 sprays into the nose daily.        . montelukast (SINGULAIR) 10 MG tablet Take 10 mg by mouth daily.       . Prenatal Vit-Fe Fumarate-FA (PRENATAL MULTIVITAMIN) 60-1 MG tablet Take 1 tablet by mouth daily.        . ranitidine (ZANTAC) 150 MG capsule Take 150 mg by mouth 2 (two) times daily.           Review of Systems - Negative except per HPI   Blood pressure 150/90, pulse 106, temperature 98.4 F (36.9 C), temperature source Oral, resp. rate 18, last menstrual period 10/22/2010, SpO2 100.00%. General appearance: alert and cooperative Lungs: clear to auscultation bilaterally Heart: regular rate and rhythm, S1, S2 normal, no murmur, click, rub or gallop Abdomen: soft, non-tender; bowel sounds normal; no masses,  no organomegaly and Gravid, size cwd, EFW 7 1/2 lbs by leopolds, breech by bedside ultrasound Extremities: extremities normal, atraumatic, no cyanosis or edema breech Baseline: 140s bpm None     Prenatal labs: ABO, Rh: O/POS/-- (12/19 2149) Antibody: NEG (12/19 2149) Rubella: Immune  RPR: NON REACTIVE (08/15 1500)  HBsAg: NEGATIVE (12/19 2149)  HIV: NON REACTIVE (12/19 2149)  GBS: NEGATIVE (07/19 1151)  1 hr Glucola 79 Genetic screening nl Anatomy US nl   Assessment: Rebecca Roman is a 45 y.o. G2P1001 with an IUP at [redacted]w[redacted]d presenting for primary cesarean section for breech presentation  Plan: 1. Admit to the OR.   Bettye Sitton 07/27/2011, 1:41 PM \

## 2011-07-27 NOTE — Transfer of Care (Signed)
Immediate Anesthesia Transfer of Care Note  Patient: Rebecca Roman  Procedure(s) Performed:  CESAREAN SECTION  Patient Location: PACU  Anesthesia Type: Spinal  Level of Consciousness: awake, alert , oriented and patient cooperative  Airway & Oxygen Therapy: Patient Spontanous Breathing  Post-op Assessment: Report given to PACU RN and Post -op Vital signs reviewed and stable  Post vital signs: Reviewed and stable  Complications: No apparent anesthesia complications

## 2011-07-27 NOTE — Anesthesia Postprocedure Evaluation (Signed)
  Anesthesia Post-op Note  Patient: Rebecca Roman  Procedure(s) Performed:  CESAREAN SECTION  Patient Location: PACU  Anesthesia Type: Spinal  Level of Consciousness: awake, alert  and oriented  Airway and Oxygen Therapy: Patient Spontanous Breathing  Post-op Pain: none  Post-op Assessment: Post-op Vital signs reviewed, Patient's Cardiovascular Status Stable, Respiratory Function Stable, Patent Airway, No signs of Nausea or vomiting, No headache, No backache, No residual numbness and No residual motor weakness  Post-op Vital Signs: Reviewed and stable  Complications: No apparent anesthesia complications

## 2011-07-27 NOTE — Op Note (Signed)
Cesarean Section Procedure Note   Rebecca Roman  07/27/2011  Indications: Breech Presentation , IUP at 39.5 wks  Pre-operative Diagnosis: Breech Presentation.   Post-operative Diagnosis: Same   Procedure: primary LTCS via pfannenstiel incision  Surgeon: Surgeon(s) and Role:    * Catalina Antigua, MD - Primary       Maryelizabeth Kaufmann, MD-Fellow  Attending Attestation: I was present and scrubbed for the entire procedure.   Assistants: none  Anesthesia: spinal    Estimated Blood Loss: 600 ml  Total IV Fluids: 1600 ml   Urine Output: 100CC OF clear urine  Specimens:  Specimens    None     Placenta sent to labor and delivery  Findings: Placenta: Intact 3VC Normal uterus and adnexa bilaterally Baby condition / location:  nursery-stable 9 lb 10 oz APGAR: 9 at one minute and 9 at five minutes.     Complications: no complications  Indications: Rebecca Roman is a 45 y.o. G2P1001 with an IUP [redacted]w[redacted]d presenting with breech presentation, not in labor.  The risks, benefits, complications, treatment options, and expected outcomes were discussed with the patient . The patient concurred with the proposed plan, giving informed consent. identified as Rebecca Roman and the procedure verified as C-Section Delivery.  Procedure Details: A Time Out was held and the above information confirmed.  The patient was taken back to the operative suite where spinal anesthesia was placed.  After induction of anesthesia, the patient was draped and prepped in the usual sterile manner and placed in a dorsal supine position with a leftward tilt. A pfannenstiel incision was made with the scalpel and carried down through the subcutaneous tissue to the fascia. A midline fascial incision was made and extended transversely with the Mayo scissors. The fascia was separated from the underlying rectus tissue superiorly and inferiorly. The peritoneum was identified and entered. Peritoneal incision was extended  longitudinally. The utero-vesical peritoneal reflection was incised transversely and the bladder flap was bluntly freed from the lower uterine segment. A low transverse uterine incision was made with the scalpel and the incision was extended bluntly. Delivered from breech complete presentation was a viable female 9 lbs 10 oz with Apgar scores of 9 at one minute and 9 at five minutes. Cord ph was not sent the umbilical cord was clamped and cut cord blood was obtained for evaluation. The placenta was removed Intact spontaneous with 3VC and appeared normal with small paratubal cysts present on Left fallopian tubes.. The uterine outline, tubes and ovaries appeared normal}. The uterine incision was closed with running locked sutures of 0Vicryl.   Hemostasis was observed. Lavage was carried out until clear. The fascia was then reapproximated with running sutures of 0Vicryl. The skin was closed with staples.  Instrument, sponge, and needle counts were correct prior the abdominal closure and were correct at the conclusion of the case.   Disposition: PACU - hemodynamically stable.   Maternal Condition: stable   Signed: Karlen Barbar,LACHELLEMD 07/27/2011 3:07 PM

## 2011-07-27 NOTE — Anesthesia Procedure Notes (Signed)
Spinal Block  Patient location during procedure: OR Start time: 07/27/2011 2:25 PM Staffing Anesthesiologist: Blakely Maranan A. Performed by: anesthesiologist  Preanesthetic Checklist Completed: patient identified, site marked, surgical consent, pre-op evaluation, timeout performed, IV checked, risks and benefits discussed and monitors and equipment checked Spinal Block Patient position: sitting Prep: DuraPrep Patient monitoring: heart rate, cardiac monitor, continuous pulse ox and blood pressure Approach: midline Location: L3-4 Injection technique: single-shot Needle Needle type: Sprotte  Needle gauge: 24 G Needle length: 9 cm Assessment Sensory level: T6 Additional Notes Patient tolerated procedure well. Adequate surgical anesthetic level.

## 2011-07-28 LAB — CBC
HCT: 36.5 % (ref 36.0–46.0)
MCH: 31.2 pg (ref 26.0–34.0)
MCHC: 32.9 g/dL (ref 30.0–36.0)
RBC: 3.85 MIL/uL — ABNORMAL LOW (ref 3.87–5.11)
RDW: 13.8 % (ref 11.5–15.5)
WBC: 12.3 10*3/uL — ABNORMAL HIGH (ref 4.0–10.5)

## 2011-07-28 NOTE — Progress Notes (Signed)
Post Op Day 1 Subjective: no complaints, up ad lib, voiding and tolerating PO, moderate lochia per report (pad changed 1 hour prior to exam, minimal discharge notes), absent BM, absent flatus, plans to breastfeed, considering pills vs nuvaring (plans to discuss at her 6 week PP visit)  Objective: Blood pressure 124/77, pulse 81, temperature 98.6 F (37 C), temperature source Oral, resp. rate 18, weight 197 lb (89.359 kg), last menstrual period 10/22/2010, SpO2 98.00%.  Physical Exam:  General: alert, cooperative and no distress Lochia: appropriate Chest: CTAB Heart: RRR no m/r/g Abdomen: +BS, soft, nontender,  Uterine Fundus: firm DVT Evaluation: No evidence of DVT seen on physical exam. Negative Homan's sign. Mild bilateral edema to ankles   Basename 07/28/11 0505 07/26/11 1500  HGB 12.0 12.6  HCT 36.5 38.3    Assessment/Plan: Plan for discharge tomorrow or next day. Baby needs circ - mom wants prior to discharge   LOS: 1 day   BOOTH, ERIN 07/28/2011, 7:36 AM

## 2011-07-29 MED ORDER — OXYCODONE-ACETAMINOPHEN 5-325 MG PO TABS: 1.0000 | ORAL_TABLET | ORAL | Status: AC | PRN

## 2011-07-29 MED ORDER — IBUPROFEN 600 MG PO TABS: 600.0000 mg | ORAL_TABLET | Freq: Four times a day (QID) | ORAL | Status: AC

## 2011-07-29 MED ORDER — DOCUSATE SODIUM 100 MG PO CAPS: 100.0000 mg | ORAL_CAPSULE | Freq: Two times a day (BID) | ORAL | Status: AC

## 2011-07-29 NOTE — Discharge Summary (Signed)
Physician Discharge Summary  Patient ID: Rebecca Roman MRN: [id number] DOB/AGE: Mar 06, 1966 45 y.o.  Admit date: 07/27/2011 Discharge date: 07/29/2011  Admission Diagnoses: term pregnancy, breech presentation  Discharge Diagnoses: Same   Discharged Condition: stable  Consults: none  Significant Diagnostic Studies:  Lab Results  Component Value Date   WBC 12.3* 07/28/2011   HGB 12.0 07/28/2011   HCT 36.5 07/28/2011   MCV 94.8 07/28/2011   PLT 176 07/28/2011     Significant procedures:  Same  APGAR: , ; weight . Delivery Note At 2:40 PM a viable female was delivered via C-Section, Low Transverse   APGAR: 9, 9; weight 9 lb 10.5 oz (4380 g).   Placenta status: Intact, Manual removal.  Cord: 3 vessels with the following complications: .  Anesthesia: Spinal  Episiotomy: None Lacerations: None Mom to postpartum.  Baby to nursery-stable.    Hospital Course: Patient is a 44yo now G2P2002 who presented at 39+5 for primary LTCS 2/2 breech presentation.  She received her [facility] and was complicated by 1)AMA.  C-section was performed by Dr Orvan Falconer with no complications.  Her post-partum course was uncomplicated.  On POD#2, she was AFVSS and meetin all goals for discharge home.  She will follow up at [facility]   Disposition: Home or Self Care   Current Discharge Medication List    CONTINUE these medications which have NOT CHANGED   Details  Docosahexaenoic Acid (DHA COMPLETE PO) Take by mouth daily.      fexofenadine-pseudoephedrine (ALLEGRA-D) 60-120 MG per tablet Take 1 tablet by mouth daily.      fluticasone (FLONASE) 50 MCG/ACT nasal spray Place 2 sprays into the nose daily.      montelukast (SINGULAIR) 10 MG tablet Take 10 mg by mouth daily.     Prenatal Vit-Fe Fumarate-FA (PRENATAL MULTIVITAMIN) 60-1 MG tablet Take 1 tablet by mouth daily.      ranitidine (ZANTAC) 150 MG capsule Take 150 mg by mouth 2 (two) times daily.           Signed: Lindaann Slough MD 07/29/2011, 6:31 AM

## 2011-07-29 NOTE — Progress Notes (Signed)
Subjective: Postpartum Day 2: Cesarean Delivery Patient reports tolerating PO, + flatus and no problems voiding.    Objective: Vital signs in last 24 hours: Temp:  [97.8 F (36.6 C)-98.6 F (37 C)] 97.8 F (36.6 C) (08/18 0600) Pulse Rate:  [73-94] 73  (08/18 0600) Resp:  [18-20] 18  (08/18 0600) BP: (121-134)/(74-85) 131/84 mmHg (08/18 0600) SpO2:  [98 %] 98 % (08/17 0945)  Physical Exam:  General: alert, cooperative and no distress Lochia: appropriate Uterine Fundus: firm Incision: healing well, no significant drainage, no dehiscence DVT Evaluation: No evidence of DVT seen on physical exam. Negative Homan's sign.   Basename 07/28/11 0505 07/26/11 1500  HGB 12.0 12.6  HCT 36.5 38.3    Assessment/Plan: Status post Cesarean section. Doing well postoperatively.  Discharge home with standard precautions and return to clinic in 4-6 weeks. Breast feeding, will discuss contraceptions options at postpartum visit.  Will follow up this week in clinic for staple removal  Delton See S. 07/29/2011, 6:30 AM

## 2011-08-06 NOTE — Op Note (Signed)
Agree with above note.  Rebecca Roman 08/06/2011 6:14 AM

## 2011-08-21 ENCOUNTER — Encounter (HOSPITAL_COMMUNITY): Payer: Self-pay | Admitting: Obstetrics and Gynecology

## 2011-09-14 ENCOUNTER — Ambulatory Visit: Payer: BC Managed Care – PPO | Admitting: Obstetrics & Gynecology

## 2011-09-19 ENCOUNTER — Encounter: Payer: Self-pay | Admitting: Obstetrics & Gynecology

## 2011-09-19 ENCOUNTER — Ambulatory Visit (INDEPENDENT_AMBULATORY_CARE_PROVIDER_SITE_OTHER): Payer: BC Managed Care – PPO | Admitting: Obstetrics & Gynecology

## 2011-09-19 NOTE — Progress Notes (Signed)
Patient is here for post partum/ she is interested in the Implanon for birth control.  She would like to have the flu vaccine.

## 2011-09-19 NOTE — Progress Notes (Signed)
  Subjective:     Rebecca Roman is a 45 y.o. female who presents for a postpartum visit. She is 7 weeks postpartum following a LTCS for breech. I have fully reviewed the prenatal and intrapartum course. The delivery was at 39 gestational weeks. Outcome: primary cesarean section, low transverse incision. Anesthesia: spinal. Postpartum course has been uncomplicated. Baby's course has been great. Baby is feeding by breast. Bleeding no bleeding. Bowel function is normal. Bladder function is normal. Patient is not sexually active. Contraception method is none. Postpartum depression screening: negative.  The following portions of the patient's history were reviewed and updated as appropriate: allergies, current medications, past family history, past medical history, past social history, past surgical history and problem list.  Review of Systems A comprehensive review of systems was negative.   Objective:    BP 110/80  Ht 5\' 8"  (1.727 m)  Wt 165 lb (74.844 kg)  BMI 25.09 kg/m2  LMP 10/22/2010  General:  alert and no distress   Breasts:  inspection negative, no nipple discharge or bleeding, no masses or nodularity palpable  Lungs: clear to auscultation bilaterally  Heart:  regular rate and rhythm  Abdomen: soft, non-tender; bowel sounds normal; no masses,  no organomegaly   Vulva:  normal  Vagina: normal vagina  Cervix:  no cervical motion tenderness and no lesions  Corpus: normal  Adnexa:  normal adnexa and no mass, fullness, tenderness  Rectal Exam: Not performed.        Assessment:    Normal postpartum exam. Pap smear not done at today's visit.   Plan:    1. Contraception: condoms, rhythm method and considering IUD. Will give pamphlets 2. No other GYN concerns 3. Follow up in: as needed.Marland Kitchen

## 2011-09-19 NOTE — Patient Instructions (Addendum)
Contraceptive Devices (IUDs) IUD stands for intrauterine device. Intrauterine means inside the womb (uterus). The purpose of the IUD is to prevent pregnancy. IUDs make it more difficult for your partner's sperm to get into your womb and into your fallopian tubes, where the eggs are fertilized. IUDs also alter the secretions of your cervix, which make it a stronger sperm barrier. They also affect the lining of the womb, so it is harder for an egg to implant. The IUD does not cause an abortion. There are 2 types of IUDs available:  Copper IUD gives off a small amount of copper inside the uterus. This prevents the sperm from going through the uterus, up into the fallopian tube, where the egg is fertilized. The copper IUD can also damage or prevent the fertilized egg from attaching on the inside lining of the uterus. It can stay in place for 10 years. The copper IUD can be used as an emergency contraceptive, if inserted within 5 days after having unprotected sexual intercourse.   Hormone IUD contains progestin (synthetic progesterone), and it releases this hormone into the uterus. The hormone thickens the mucus on the cervix and prevents sperm from entering the uterus. It also weakens the sperm that get into the uterus, so that the sperm and egg cannot live in the fallopian tube. It also makes the inside lining of the uterus thinner, which makes it difficult for a fertilized egg to attach to the uterus. The hormone progestin in the IUD decreases the amount of bleeding during a menstrual period and can be helpful in women who have heavy menstrual periods. The hormone IUD can stay in place for 5 years.  SIDE EFFECTS OF THE IUD  There may be more cramping or pain with periods.   It may cause heavier, longer periods, which can cause lack of red blood cells (anemia) and can interfere with your daily and sexual activities.  This method of birth control is not usually the best choice for a woman with heavy or  prolonged periods. The birth control pill may be a better choice. IUDs work best for women who have already had a pregnancy, because the cervix is more open, making the insertion of the device easier and less painful. However, many women without children use the IUD. One of the main goals of patient selection is to prevent unintentionally inserting an IUD into a patient who has an STD (sexually transmitted disease), who is at high risk of exposure to an STD, or who is already pregnant. That is why the IUD is inserted during, or right after, a menstrual period. REASONS NOT TO USE AN IUD  The womb or cervix is not shaped normally.   You have or have had a pelvic infection, such as an STD, in the past 3 months.   You have or suspect cancer in the female organs.   You have an abnormal Pap smear.   You have certain liver diseases.   There is severe infection or inflammation of the cervix (cervicitis).   You have unexplained vaginal bleeding.   You have heart valve problems (unless a heart specialist advises otherwise).   You are allergic to copper (rare).   You previously had a pregnancy outside the uterus (ectopic).   You are pregnant or suspect you are pregnant.   You have prolonged or heavy periods, or heavy pain or cramping with periods (except for the hormone IUD).   You have or suspect pelvic cancer.   You have an  STD.   The cervix or uterus has problems (cervical stenosis, fibroids in the uterus) making it difficult to insert the IUD.  IUDs should be removed when a woman becomes menopausal or pregnant. BENEFITS  You are always ready to have sexual intercourse.   The copper IUD does not interfere with your female hormones.   The copper IUD can be used as emergency contraception.   An IUD can be used while nursing.   It works immediately after insertion, and there is no problem getting pregnant when it is removed.   It does not interfere with foreplay.   The  progesterone IUD can make heavy menstrual periods lighter.   The progesterone IUD can be used for 5 years.   The copper IUD can be used for 10 years.  RISKS  Putting the IUD through the uterus, into the pelvis or abdomen (perforation of the uterus).   Losing the IUD (expulsion). This is more common in women who never had children.   When pregnant with an IUD, there is an increased chance of an infection and loss of the pregnancy.   Pregnancy in the fallopian tube (ectopic).   STD, in women who have more than 1 sex partner. The IUD does not protect against STDs.   Other minor side effects may include:   Headaches.   Feeling sick to your stomach (nausea).   Breast tenderness.   Depression.  PROCEDURE  The IUD is inserted during or right after a menstrual period, to make sure you are not pregnant.   You will be given a consent form to read about the IUD, how it works, how it is inserted, and the side effects.   You will lie on an exam table, naked from the waist down.   Your caregiver will do an exam to determine the size and position of your uterus.   Usually, an anesthetic is not needed.   Your caregiver may give you a pain pill to take, 1 or 2 hours before the procedure.   Sometimes, a paracervical block may be used to block and control any discomfort with insertion.   A tool (speculum) is then placed in your vagina (birth canal) so your caregiver can see the cervix.   A sound is sent into the uterus to check the depth of the uterus.   A slim instrument (IUD inserter), which is shaped like a drinking straw, is inserted through the small opening in your cervix and into your uterus.   Then, the IUD is pushed in with a plunger, much like a syringe, and the inserter is removed. There may be some cramping and pain during the insertion.   Relaxing helps to lessen the discomfort.   Following the procedure, you will usually spot blood. Have some pads with you. Avoid using  tampons for 2 weeks. Bleeding after the procedure is normal. It varies from light spotting for a few days to menstrual-like bleeding for up to 3 weeks. It may be like a period if it is near the time for your normal period.   You may also have mild cramping. Only take over-the-counter or prescription medicines for pain, discomfort, or fever as directed by your caregiver. Do not use aspirin, as this may increase bleeding.   Practice checking the string coming out of the cervix, to make sure the IUD is always in the uterus.  HOME CARE INSTRUCTIONS  Do not drive for 24 hours.   Only take over-the-counter or prescription medicines for  pain, discomfort, or fever as directed by your caregiver.   No tampons, douching, or sexual intercourse for 2 weeks, or until your caregiver approves.   Rest at home for 24 hours. You may then resume normal activities, unless told otherwise by your caregiver.   Check your IUD prior to resuming sexual activity, to make sure it is in place. Make sure that you can feel the strings. An IUD can be pushed out and lost without the user even knowing it is gone. Also, if the strings are getting longer, it may mean that the IUD is being forced out of the uterus. This means it is no longer offering full protection from pregnancy.   Take any medications your caregiver has ordered, as directed.   Make sure to keep your recheck appointment, so your caregiver can make sure your IUD has remained in place. After that exam, yearly exams are advised, unless you cannot feel the strings of your IUD.   Check that the IUD is still in place by feeling for the strings after every menstrual period.  SEEK MEDICAL CARE IF:  Bleeding is heavier than a normal menstrual cycle.   You have an oral temperature above 101.   You have increasing cramps or pains, not relieved with medicine. Or you develop belly (abdominal) pain that does not seem to be related to the same area of earlier cramping and  pain.   You are lightheaded, unusually weak, or have fainting episodes.   You develop pain in the tops of your shoulders (shoulder strap areas).   You are having problems or questions, which have not been answered well enough by your caregiver.   You develop abdominal pain.   You have pain during sexual intercourse.   You cannot feel the IUD strings.   You have abnormal vaginal discharge.   You feel the IUD at the opening of the cervix in the vagina.   You think you are pregnant.   You miss your menstrual period.   The IUD string is hurting your sex partner.   The IUD string has gotten longer.  Document Released: 10/31/2004 Document Re-Released: 02/21/2010 [facility] Patient Information 2011 [place], [place].  Preventative Care for Adults - Female Studies show that half of deaths in the [place] today result from unhealthy lifestyle practices. This includes ignoring preventive care suggestions. Preventive health guidelines for women include the following key practices:  A routine yearly physical is a good way to check with your primary caregiver about your health and preventive screening. It is a chance to share any concerns and updates on your health, and to receive a thorough all-over exam.   If you smoke cigarettes, find out from your caregiver how to quit. It can literally save your life, no matter how long you have been a tobacco user. If you do not use tobacco, never start.   Maintain a healthy diet and normal weight. Increased weight leads to problems with blood pressure and diabetes. Decrease saturated fat in your diet and increase regular exercise. Eat a variety of foods, including fruit, vegetables, animal or vegetable protein (meat, fish, chicken, and eggs, or beans, lentils, and tofu), and grains, such as rice. Get information about proper diet from your caregiver, if needed.   Aerobic exercise helps maintain good heart health. The CDC and the [employer] of  Sports Medicine recommend 30 minutes of moderate-intensity exercise (a brisk walk that increases your heart rate and breathing) on most days of the week. Ongoing  high blood pressure should be treated with medicines, if weight loss and exercise are not effective.   Avoid smoking, drinking too much alcohol (more than two drinks per day), and use of street drugs. Do not share needles with anyone. Ask for professional help if you need support or instructions about stopping the use of alcohol, cigarettes, or drugs.   Maintain normal blood lipids and cholesterol, by minimizing your intake of saturated fat. Eat a well rounded diet, with plenty of fruit and vegetables. The [employer] of Health encourage women to eat 5-9 servings of fruit and vegetables each day. Your caregiver can give instructions to help you keep your risk of heart disease or stroke low. High blood pressure causes heart disease and increases risk of stroke. Blood pressure should be checked every 1-2 years, from age 80 onward.   Blood tests for high cholesterol, which causes heart and vessel disease, should begin at age 16 and be repeated every 5 years, if test results are normal. (Repeat tests more often if results are high.)   Diabetes screening involves taking a blood sample to check your blood sugar level, after a fasting period. This is done once every 3 years, after age 82, if test results are normal.   Breast cancer screening is essential to preventive care for women. All women age 29 and older should perform a breast self-exam every month. At age 87 and older, women should have their caregiver complete a breast exam each year. Women at ages 35-50 should have a mammogram (x-ray film) of the breasts each year. Your caregiver can discuss when to start your yearly mammograms.   Cervical cancer screening includes taking a Pap smear (sample of cells examined under a microscope) from the cervix (end of the uterus). It also includes  testing for HPV (Human Papilloma Virus, which can cause cervical cancer). Screening and a pelvic exam should begin at age 33, or 3 years after a woman becomes sexually active. Screening should occur every year, with a Pap smear but no HPV testing, up to age 110. After age 80, you should have a Pap smear every 3 years with HPV testing, if no HPV was found previously.   Colon cancer can be detected, and often prevented, long before it is life threatening. Most routine colon cancer screening begins at the age of 71. On a yearly basis, your caregiver may provide easy-to-use take-home tests to check for hidden blood in the stool. Use of a small camera at the end of a tube, to directly examine the colon (sigmoidoscopy or colonoscopy), can detect the earliest forms of colon cancer and can be life saving. Talk to your caregiver about this at age 10, when routine screening begins. (Screening is repeated every 5 years, unless early forms of pre-cancerous polyps or small growths are found.)   Practice safe sex. Use condoms. Condoms are used for birth control and to reduce the spread of sexually transmitted infections (STIs). Unsafe sex is sexual activity without the use of safeguards, such as condoms and avoidance of high-risk acts, to reduce the chances of getting or spreading STIs. STIs include gonorrhea (the clap), chlamydia, syphilis, trichimonas, herpes, HPV (human papilloma virus) and HIV (human immunodeficiency virus), which causes AIDS. Herpes, HIV, and HPV are viral illnesses that have no cure. They can result in disability, cancer, and death.   HPV causes cancer of the cervix, and other infections that can be transmitted from person to person. There is a vaccine for HPV, and  females should get immunized between the ages of 53 and 50. It requires a series of 3 shots.   Osteoporosis is a disease in which the bones lose minerals and strength as we age. This can result in serious bone fractures. Risk of  osteoporosis can be identified using a bone density scan. Women ages 87 and over should discuss this with their caregivers, as should women after menopause who have other risk factors. Ask your caregiver whether you should be taking a calcium supplement and Vitamin D, to reduce the rate of osteoporosis.   Menopause can be associated with physical symptoms and risks. Hormone replacement therapy is available to decrease these. You should talk to your caregiver about whether starting or continuing to take hormones is right for you.   Use sunscreen with SPF (skin protection factor) of 15 or more. Apply sunscreen liberally and repeatedly throughout the day. Being outside in the sun, when your shadow is shorter than you are, means you are being exposed to sun at greater intensity. Lighter skinned people are at a greater risk of skin cancer. Wear sunglasses, to protect your eyes from too much damaging sunlight (which can speed up cataract formation).   Once a month, do a whole body skin exam or review, using a mirror to look at your back. Notify your caregiver of changes in moles, especially if there are changes in shapes, colors, irregular border, a size larger than a pencil eraser, or new moles develop.   Keep carbon monoxide and smoke detectors in your home, and functioning, at all times. Change the batteries every 6 months, or use a model that plugs into the wall.   Stay up to date with your tetanus shots and other required immunizations. A booster for tetanus should be given every 10 years. Be sure to get your flu shot every year, since 5%-20% of the [place] population comes down with the flu. The composition of the flu vaccine changes each year, so being vaccinated once is not enough. Get your shot in the fall, before the flu season peaks. The table below lists important vaccines to get. Other vaccines to consider include for Hepatitis A virus (to prevent a form of infection of the liver, by a virus acquired  from food), Varicella Zoster (a virus that causes shingles), and Meninogoccal (against bacteria which cause a form of meningitis).   Brush your teeth twice a day with fluoride toothpaste, and floss once a day. Good oral hygiene prevents tooth decay and gum disease, which can be painful and can cause other health problems. Visit your dentist for a routine oral and dental check up and preventive care every 6-12 months.   The Body Mass Index or BMI is a way of measuring how much of your body is fat. Having a BMI above 27 increases the risk of heart disease, diabetes, hypertension, stroke and other problems related to obesity. Your caregiver can help determine your BMI, and can develop an exercise and dietary program to help you achieve or maintain this measurement at a healthy level.   Wear seat belts whenever you are in a vehicle, whether as passenger or driver, and even for short drives of a few minutes.   If you bicycle, wear a helmet at all times.  Preventative Care for Adult Women  Preventative Services Ages 72-39 Ages 16-64 Ages 32 and over  Health risk assessment and lifestyle counseling.     Blood pressure check.** Every 1-2 years Every 1-2 years Every 1-2 years  Total cholesterol check including HDL.** Every 5 years beginning at age 15 Every 5 years beginning at age 31, or more often if risk is high Every 5 years through age 42, then optional  Breast self exam. Monthly in all women ages 30 and older Monthly Monthly  Clinical breast exam.** Every 3 years beginning at age 78 Every year Every year  Mammogram.**  Every year beginning at age 43, optional from age 5-49 (discuss with your caregiver). Every year until age 14, then optional  Pap Smear** and HPV Screening. Every year from ages 75 through 91 Every 3 years from ages 63 through 82, if HPV is negative Optional; talk with your caregiver  Flexible sigmoidoscopy** or colonoscopy.**   Every 5 years beginning at age 65 Every 5 years until age  15; then optional  FOBT (fecal occult blood test) of stool.  Every year beginning at age 77 Every year until 14; then optional  Skin self-exam. Monthly Monthly Monthly  Tetanus-diphtheria (Td) immunization. Every 10 years Every 10 years Every 10 years  Influenza immunization.** Every year Every year Every year  HPV immunization. Once between the ages of 67 and 53     Pneumococcal immunization.** Optional Optional Every 5 years  Hepatitis B immunization.** Series of 3 immunizations  (if not done previously, usually given at 0, 1 to 2, and 4 to 6 months)  Check with your caregiver, if vaccination not previously given Check with your caregiver, if vaccination not previously given  ** Family history and personal history of risk and conditions may change your caregiver's recommendations.  Document Released: 01/23/2002 Document Re-Released: 02/21/2010 [facility] Patient Information 2011 [place], [place].

## 2011-09-26 ENCOUNTER — Encounter (HOSPITAL_COMMUNITY): Payer: Self-pay | Admitting: *Deleted

## 2011-12-23 IMAGING — US US OB DETAIL+14 WK
1 series · 14 of 28 positions shown · non-contrast
Comparison: none

[Series 1: us ob detail+14 wk · 0.19mm/px · 14 of 88 slices shown]
[im 4/88]
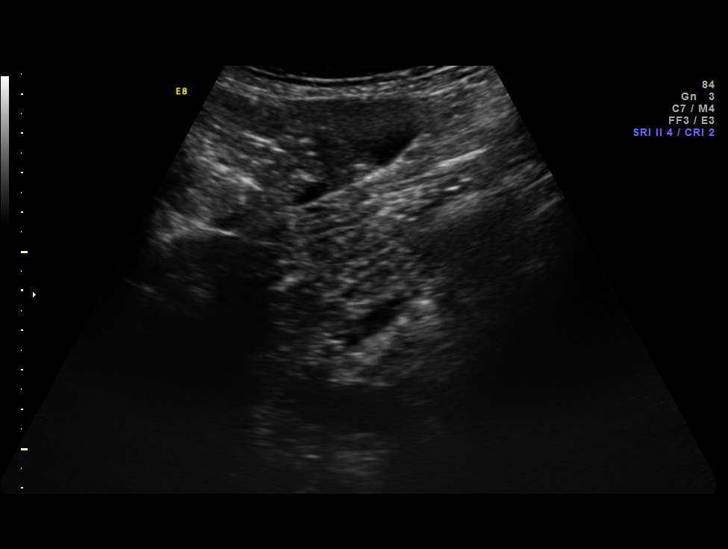
[im 10/88]
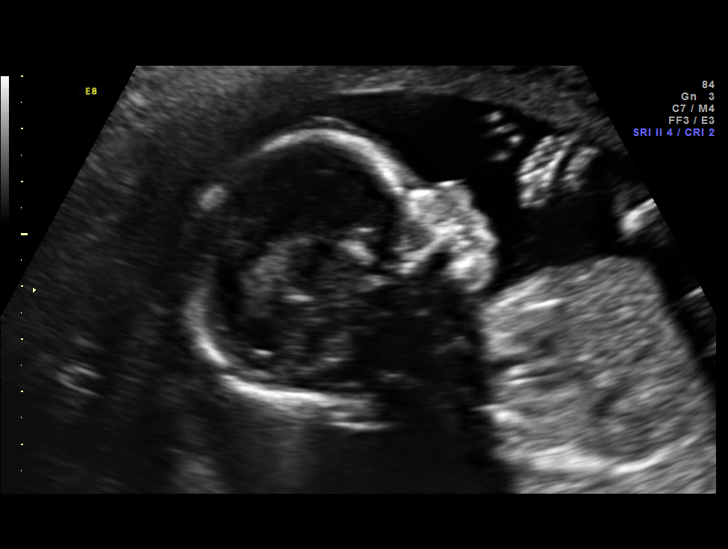
[im 17/88]
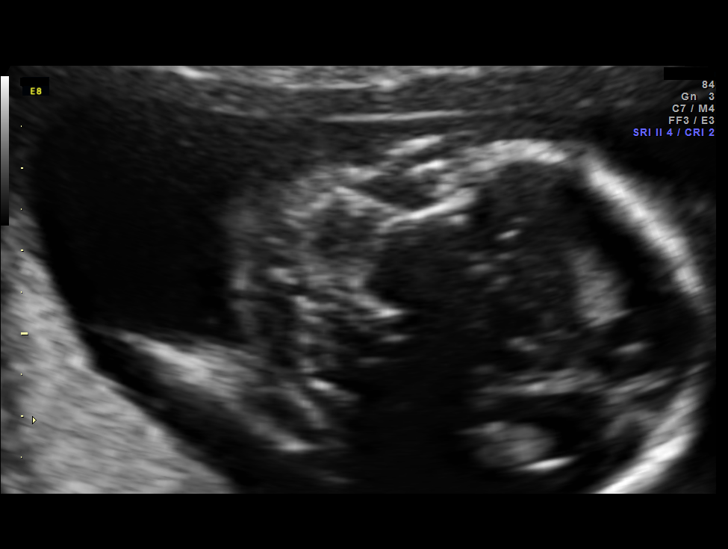
[im 23/88]
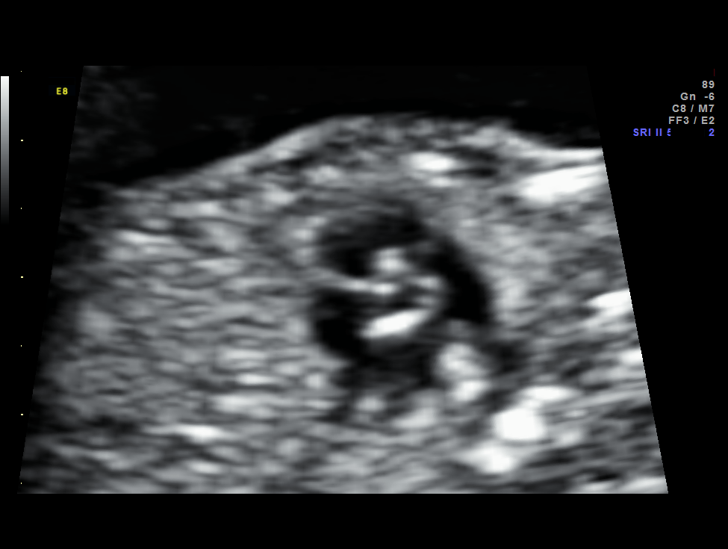
[im 30/88]
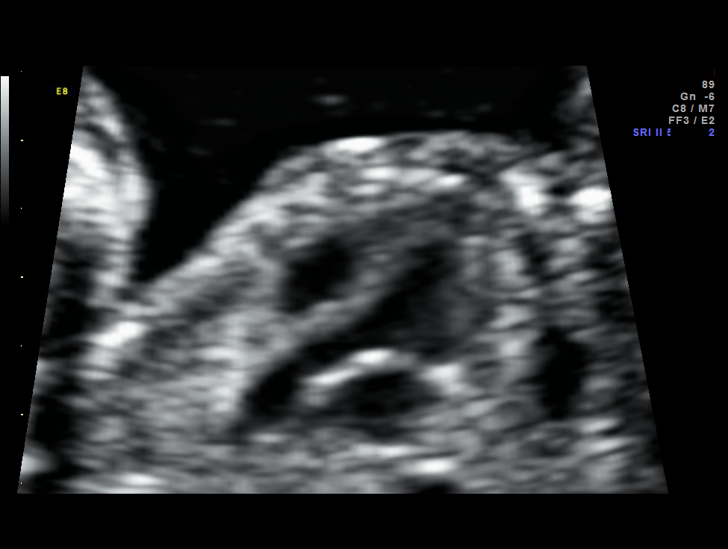
[im 36/88]
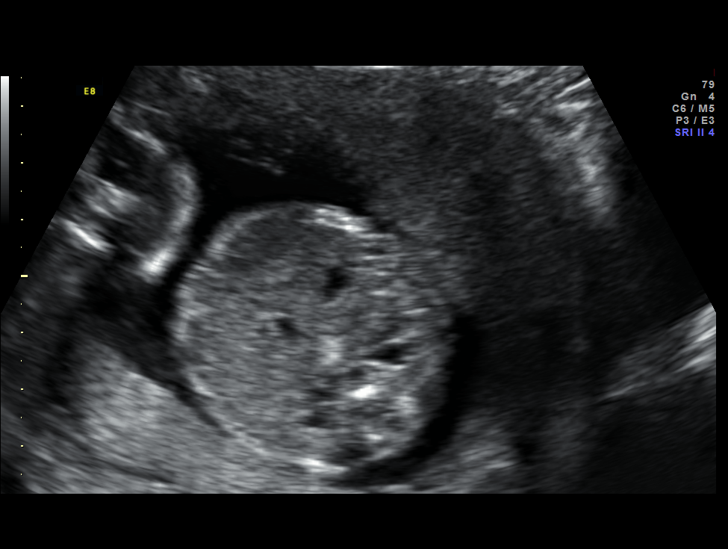
[im 42/88]
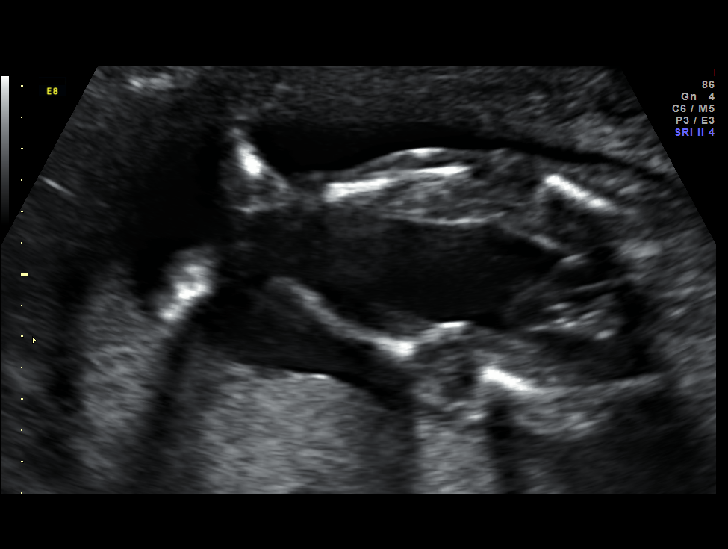
[im 49/88]
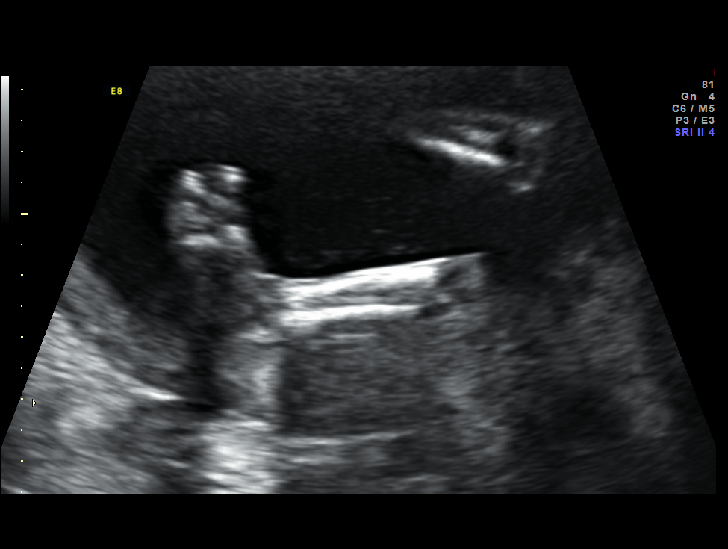
[im 55/88]
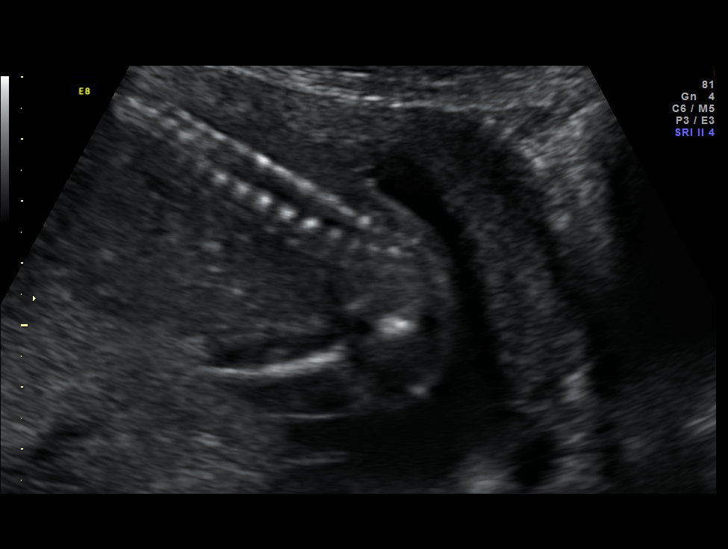
[im 62/88]
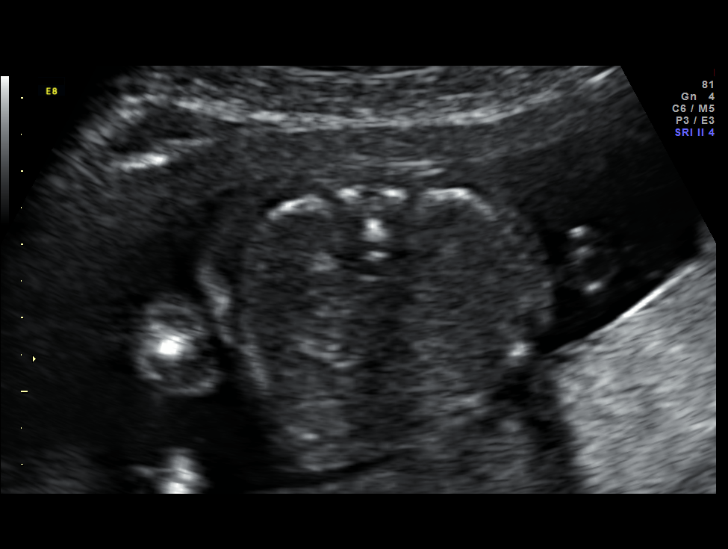
[im 68/88]
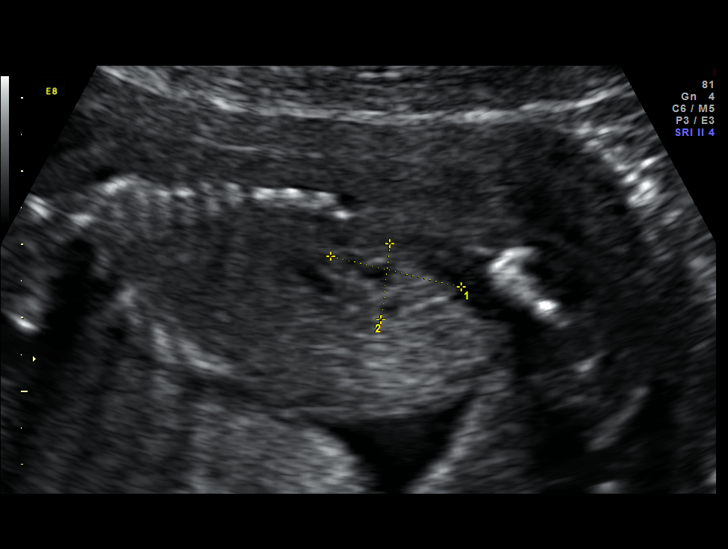
[im 75/88]
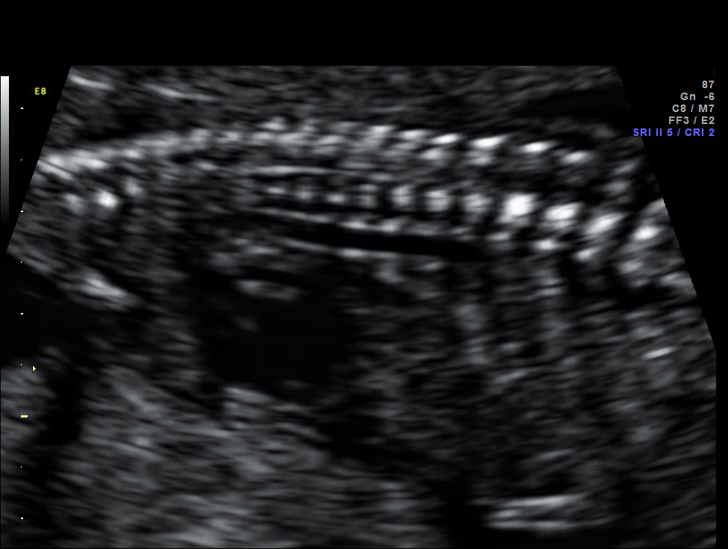
[im 81/88]
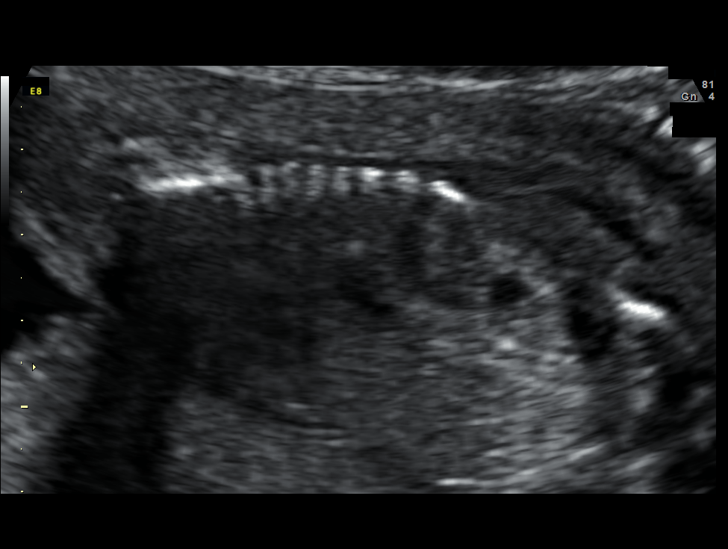
[im 88/88]
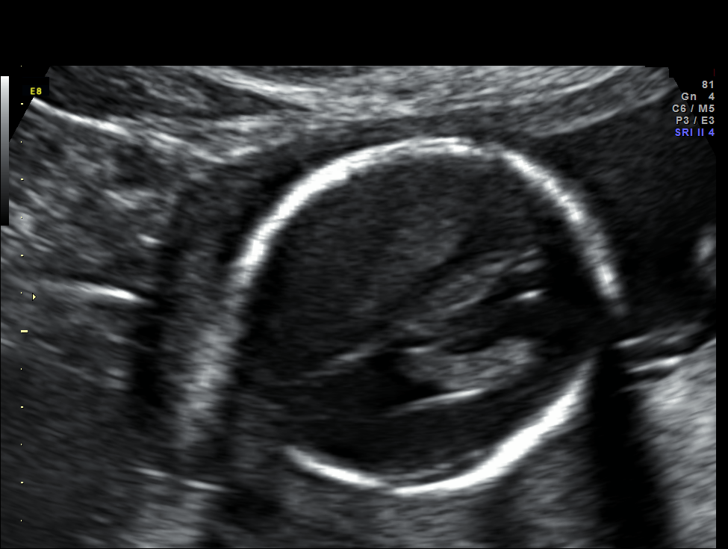

[14 of 28 positions shown; findings below may reference images not displayed]

Canned report from images found in remote index.

Refer to host system for actual result text.

## 2012-03-13 ENCOUNTER — Encounter (HOSPITAL_COMMUNITY): Payer: Self-pay | Admitting: *Deleted

## 2012-05-09 ENCOUNTER — Telehealth: Payer: Self-pay | Admitting: *Deleted

## 2012-05-09 DIAGNOSIS — Z9109 Other allergy status, other than to drugs and biological substances: Secondary | ICD-10-CM

## 2012-05-09 MED ORDER — FLUTICASONE PROPIONATE 50 MCG/ACT NA SUSP: 2.0000 | Freq: Every day | NASAL | Status: DC

## 2012-05-09 MED ORDER — FEXOFENADINE-PSEUDOEPHED ER 60-120 MG PO TB12: 1.0000 | ORAL_TABLET | Freq: Every day | ORAL | Status: DC

## 2012-05-09 MED ORDER — MONTELUKAST SODIUM 10 MG PO TABS: 10.0000 mg | ORAL_TABLET | Freq: Every day | ORAL | Status: DC

## 2012-05-09 NOTE — Telephone Encounter (Signed)
Patient needs medication called to mail order and pharmacy.

## 2012-08-14 ENCOUNTER — Telehealth: Payer: Self-pay | Admitting: *Deleted

## 2012-08-14 DIAGNOSIS — B379 Candidiasis, unspecified: Secondary | ICD-10-CM

## 2012-08-14 DIAGNOSIS — J302 Other seasonal allergic rhinitis: Secondary | ICD-10-CM

## 2012-08-14 MED ORDER — FEXOFENADINE-PSEUDOEPHED ER 180-240 MG PO TB24: 1.0000 | ORAL_TABLET | Freq: Every day | ORAL | Status: AC

## 2012-08-14 MED ORDER — FLUCONAZOLE 150 MG PO TABS: 150.0000 mg | ORAL_TABLET | Freq: Once | ORAL | Status: AC

## 2012-08-14 MED ORDER — FLUCONAZOLE 150 MG PO TABS: 150.0000 mg | ORAL_TABLET | Freq: Once | ORAL | Status: DC

## 2012-08-14 NOTE — Telephone Encounter (Signed)
[facility], [facility] to [facility].

## 2012-08-14 NOTE — Telephone Encounter (Signed)
Patient has clumpy irritation white discharge and would like meds for a yeast infection.

## 2012-11-04 ENCOUNTER — Telehealth: Payer: Self-pay | Admitting: *Deleted

## 2012-11-04 DIAGNOSIS — B379 Candidiasis, unspecified: Secondary | ICD-10-CM

## 2012-11-04 DIAGNOSIS — J019 Acute sinusitis, unspecified: Secondary | ICD-10-CM

## 2012-11-04 MED ORDER — AZITHROMYCIN 250 MG PO TABS: ORAL_TABLET | ORAL | Status: DC

## 2012-11-04 MED ORDER — FLUCONAZOLE 150 MG PO TABS: 150.0000 mg | ORAL_TABLET | Freq: Once | ORAL | Status: DC

## 2012-11-04 NOTE — Telephone Encounter (Signed)
Patient has a yeast infection and would like to have meds called in, she would also like to have an antibiotic for a sinus infection that has been getting worse over the past couple of weeks.

## 2012-11-11 ENCOUNTER — Telehealth: Payer: Self-pay

## 2012-11-11 NOTE — Telephone Encounter (Signed)
Patient called wanting to have her Mammogram scheduled on the same day as her visit with [place]. I called and made her an appointment with Herbert Seta at [facility] imaging for 11/12/12 @11 :30am. No order was needed due to it being a screening order.

## 2012-11-12 ENCOUNTER — Ambulatory Visit (INDEPENDENT_AMBULATORY_CARE_PROVIDER_SITE_OTHER): Payer: BC Managed Care – PPO | Admitting: Family Medicine

## 2012-11-12 ENCOUNTER — Encounter: Payer: Self-pay | Admitting: Family Medicine

## 2012-11-12 VITALS — BP 120/87 | HR 101 | Ht 68.0 in | Wt 145.4 lb

## 2012-11-12 DIAGNOSIS — Z01419 Encounter for gynecological examination (general) (routine) without abnormal findings: Secondary | ICD-10-CM

## 2012-11-12 DIAGNOSIS — Z124 Encounter for screening for malignant neoplasm of cervix: Secondary | ICD-10-CM

## 2012-11-12 DIAGNOSIS — Z1151 Encounter for screening for human papillomavirus (HPV): Secondary | ICD-10-CM

## 2012-11-12 NOTE — Progress Notes (Signed)
  Subjective:     Rebecca Roman is a 46 y.o. female and is here for a comprehensive physical exam. The patient reports no problems.  History   Social History  . Marital Status: Single    Spouse Name: N/A    Number of Children: N/A  . Years of Education: N/A   Occupational History  . Not on file.   Social History Main Topics  . Smoking status: Never Smoker   . Smokeless tobacco: Never Used  . Alcohol Use: No  . Drug Use: No  . Sexually Active: Not Currently -- Female partner(s)   Other Topics Concern  . Not on file   Social History Narrative  . No narrative on file   Health Maintenance  Topic Date Due  . Pap Smear  08/22/1985  . Influenza Vaccine  08/11/2012  . Tetanus/tdap  07/28/2021    The following portions of the patient's history were reviewed and updated as appropriate: allergies, current medications, past family history, past medical history, past social history, past surgical history and problem list.  Review of Systems A comprehensive review of systems was negative.   Objective:    BP 120/87  Pulse 101  Ht 5\' 8"  (1.727 m)  Wt 145 lb 6.4 oz (65.953 kg)  BMI 22.11 kg/m2  LMP 09/28/2012  Breastfeeding? Yes General appearance: alert, cooperative and appears stated age Head: Normocephalic, without obvious abnormality, atraumatic Neck: no adenopathy, supple, symmetrical, trachea midline and thyroid not enlarged, symmetric, no tenderness/mass/nodules Lungs: clear to auscultation bilaterally Breasts: normal appearance, no masses or tenderness Heart: regular rate and rhythm, S1, S2 normal, no murmur, click, rub or gallop Abdomen: soft, non-tender; bowel sounds normal; no masses,  no organomegaly Pelvic: cervix normal in appearance, external genitalia normal, no adnexal masses or tenderness, no cervical motion tenderness, uterus normal size, shape, and consistency and vagina normal without discharge Extremities: extremities normal, atraumatic, no cyanosis or  edema Pulses: 2+ and symmetric Skin: Skin color, texture, turgor normal. No rashes or lesions Lymph nodes: Cervical, supraclavicular, and axillary nodes normal. Neurologic: Grossly normal    Assessment:    Healthy female exam. Continues to breast feed.  Recent lab work and flu shot through work.     Plan:    Pap smear today Mammogram today. See After Visit Summary for Counseling Recommendations

## 2012-11-12 NOTE — Patient Instructions (Signed)
Preventive Care for Adults, Female A healthy lifestyle and preventive care can promote health and wellness. Preventive health guidelines for women include the following key practices.  A routine yearly physical is a good way to check with your caregiver about your health and preventive screening. It is a chance to share any concerns and updates on your health, and to receive a thorough exam.  Visit your dentist for a routine exam and preventive care every 6 months. Brush your teeth twice a day and floss once a day. Good oral hygiene prevents tooth decay and gum disease.  The frequency of eye exams is based on your age, health, family medical history, use of contact lenses, and other factors. Follow your caregiver's recommendations for frequency of eye exams.  Eat a healthy diet. Foods like vegetables, fruits, whole grains, low-fat dairy products, and lean protein foods contain the nutrients you need without too many calories. Decrease your intake of foods high in solid fats, added sugars, and salt. Eat the right amount of calories for you.Get information about a proper diet from your caregiver, if necessary.  Regular physical exercise is one of the most important things you can do for your health. Most adults should get at least 150 minutes of moderate-intensity exercise (any activity that increases your heart rate and causes you to sweat) each week. In addition, most adults need muscle-strengthening exercises on 2 or more days a week.  Maintain a healthy weight. The body mass index (BMI) is a screening tool to identify possible weight problems. It provides an estimate of body fat based on height and weight. Your caregiver can help determine your BMI, and can help you achieve or maintain a healthy weight.For adults 20 years and older:  A BMI below 18.5 is considered underweight.  A BMI of 18.5 to 24.9 is normal.  A BMI of 25 to 29.9 is considered overweight.  A BMI of 30 and above is  considered obese.  Maintain normal blood lipids and cholesterol levels by exercising and minimizing your intake of saturated fat. Eat a balanced diet with plenty of fruit and vegetables. Blood tests for lipids and cholesterol should begin at age 20 and be repeated every 5 years. If your lipid or cholesterol levels are high, you are over 50, or you are at high risk for heart disease, you may need your cholesterol levels checked more frequently.Ongoing high lipid and cholesterol levels should be treated with medicines if diet and exercise are not effective.  If you smoke, find out from your caregiver how to quit. If you do not use tobacco, do not start.  If you are pregnant, do not drink alcohol. If you are breastfeeding, be very cautious about drinking alcohol. If you are not pregnant and choose to drink alcohol, do not exceed 1 drink per day. One drink is considered to be 12 ounces (355 mL) of beer, 5 ounces (148 mL) of wine, or 1.5 ounces (44 mL) of liquor.  Avoid use of street drugs. Do not share needles with anyone. Ask for help if you need support or instructions about stopping the use of drugs.  High blood pressure causes heart disease and increases the risk of stroke. Your blood pressure should be checked at least every 1 to 2 years. Ongoing high blood pressure should be treated with medicines if weight loss and exercise are not effective.  If you are 55 to 45 years old, ask your caregiver if you should take aspirin to prevent strokes.  Diabetes   screening involves taking a blood sample to check your fasting blood sugar level. This should be done once every 3 years, after age 45, if you are within normal weight and without risk factors for diabetes. Testing should be considered at a younger age or be carried out more frequently if you are overweight and have at least 1 risk factor for diabetes.  Breast cancer screening is essential preventive care for women. You should practice "breast  self-awareness." This means understanding the normal appearance and feel of your breasts and may include breast self-examination. Any changes detected, no matter how small, should be reported to a caregiver. Women in their 20s and 30s should have a clinical breast exam (CBE) by a caregiver as part of a regular health exam every 1 to 3 years. After age 40, women should have a CBE every year. Starting at age 40, women should consider having a mammography (breast X-ray test) every year. Women who have a family history of breast cancer should talk to their caregiver about genetic screening. Women at a high risk of breast cancer should talk to their caregivers about having magnetic resonance imaging (MRI) and a mammography every year.  The Pap test is a screening test for cervical cancer. A Pap test can show cell changes on the cervix that might become cervical cancer if left untreated. A Pap test is a procedure in which cells are obtained and examined from the lower end of the uterus (cervix).  Women should have a Pap test starting at age 21.  Between ages 21 and 29, Pap tests should be repeated every 2 years.  Beginning at age 30, you should have a Pap test every 3 years as long as the past 3 Pap tests have been normal.  Some women have medical problems that increase the chance of getting cervical cancer. Talk to your caregiver about these problems. It is especially important to talk to your caregiver if a new problem develops soon after your last Pap test. In these cases, your caregiver may recommend more frequent screening and Pap tests.  The above recommendations are the same for women who have or have not gotten the vaccine for human papillomavirus (HPV).  If you had a hysterectomy for a problem that was not cancer or a condition that could lead to cancer, then you no longer need Pap tests. Even if you no longer need a Pap test, a regular exam is a good idea to make sure no other problems are  starting.  If you are between ages 65 and 70, and you have had normal Pap tests going back 10 years, you no longer need Pap tests. Even if you no longer need a Pap test, a regular exam is a good idea to make sure no other problems are starting.  If you have had past treatment for cervical cancer or a condition that could lead to cancer, you need Pap tests and screening for cancer for at least 20 years after your treatment.  If Pap tests have been discontinued, risk factors (such as a new sexual partner) need to be reassessed to determine if screening should be resumed.  The HPV test is an additional test that may be used for cervical cancer screening. The HPV test looks for the virus that can cause the cell changes on the cervix. The cells collected during the Pap test can be tested for HPV. The HPV test could be used to screen women aged 30 years and older, and should   be used in women of any age who have unclear Pap test results. After the age of 30, women should have HPV testing at the same frequency as a Pap test.  Colorectal cancer can be detected and often prevented. Most routine colorectal cancer screening begins at the age of 50 and continues through age 75. However, your caregiver may recommend screening at an earlier age if you have risk factors for colon cancer. On a yearly basis, your caregiver may provide home test kits to check for hidden blood in the stool. Use of a small camera at the end of a tube, to directly examine the colon (sigmoidoscopy or colonoscopy), can detect the earliest forms of colorectal cancer. Talk to your caregiver about this at age 50, when routine screening begins. Direct examination of the colon should be repeated every 5 to 10 years through age 75, unless early forms of pre-cancerous polyps or small growths are found.  Hepatitis C blood testing is recommended for all people born from 1945 through 1965 and any individual with known risks for hepatitis C.  Practice  safe sex. Use condoms and avoid high-risk sexual practices to reduce the spread of sexually transmitted infections (STIs). STIs include gonorrhea, chlamydia, syphilis, trichomonas, herpes, HPV, and human immunodeficiency virus (HIV). Herpes, HIV, and HPV are viral illnesses that have no cure. They can result in disability, cancer, and death. Sexually active women aged 25 and younger should be checked for chlamydia. Older women with new or multiple partners should also be tested for chlamydia. Testing for other STIs is recommended if you are sexually active and at increased risk.  Osteoporosis is a disease in which the bones lose minerals and strength with aging. This can result in serious bone fractures. The risk of osteoporosis can be identified using a bone density scan. Women ages 65 and over and women at risk for fractures or osteoporosis should discuss screening with their caregivers. Ask your caregiver whether you should take a calcium supplement or vitamin D to reduce the rate of osteoporosis.  Menopause can be associated with physical symptoms and risks. Hormone replacement therapy is available to decrease symptoms and risks. You should talk to your caregiver about whether hormone replacement therapy is right for you.  Use sunscreen with sun protection factor (SPF) of 30 or more. Apply sunscreen liberally and repeatedly throughout the day. You should seek shade when your shadow is shorter than you. Protect yourself by wearing long sleeves, pants, a wide-brimmed hat, and sunglasses year round, whenever you are outdoors.  Once a month, do a whole body skin exam, using a mirror to look at the skin on your back. Notify your caregiver of new moles, moles that have irregular borders, moles that are larger than a pencil eraser, or moles that have changed in shape or color.  Stay current with required immunizations.  Influenza. You need a dose every fall (or winter). The composition of the flu vaccine  changes each year, so being vaccinated once is not enough.  Pneumococcal polysaccharide. You need 1 to 2 doses if you smoke cigarettes or if you have certain chronic medical conditions. You need 1 dose at age 65 (or older) if you have never been vaccinated.  Tetanus, diphtheria, pertussis (Tdap, Td). Get 1 dose of Tdap vaccine if you are younger than age 65, are over 65 and have contact with an infant, are a healthcare worker, are pregnant, or simply want to be protected from whooping cough. After that, you need a Td   booster dose every 10 years. Consult your caregiver if you have not had at least 3 tetanus and diphtheria-containing shots sometime in your life or have a deep or dirty wound.  HPV. You need this vaccine if you are a woman age 26 or younger. The vaccine is given in 3 doses over 6 months.  Measles, mumps, rubella (MMR). You need at least 1 dose of MMR if you were born in 1957 or later. You may also need a second dose.  Meningococcal. If you are age 19 to 21 and a first-year college student living in a residence hall, or have one of several medical conditions, you need to get vaccinated against meningococcal disease. You may also need additional booster doses.  Zoster (shingles). If you are age 60 or older, you should get this vaccine.  Varicella (chickenpox). If you have never had chickenpox or you were vaccinated but received only 1 dose, talk to your caregiver to find out if you need this vaccine.  Hepatitis A. You need this vaccine if you have a specific risk factor for hepatitis A virus infection or you simply wish to be protected from this disease. The vaccine is usually given as 2 doses, 6 to 18 months apart.  Hepatitis B. You need this vaccine if you have a specific risk factor for hepatitis B virus infection or you simply wish to be protected from this disease. The vaccine is given in 3 doses, usually over 6 months. Preventive Services / Frequency Ages 19 to 39  Blood  pressure check.** / Every 1 to 2 years.  Lipid and cholesterol check.** / Every 5 years beginning at age 20.  Clinical breast exam.** / Every 3 years for women in their 20s and 30s.  Pap test.** / Every 2 years from ages 21 through 29. Every 3 years starting at age 30 through age 65 or 70 with a history of 3 consecutive normal Pap tests.  HPV screening.** / Every 3 years from ages 30 through ages 65 to 70 with a history of 3 consecutive normal Pap tests.  Hepatitis C blood test.** / For any individual with known risks for hepatitis C.  Skin self-exam. / Monthly.  Influenza immunization.** / Every year.  Pneumococcal polysaccharide immunization.** / 1 to 2 doses if you smoke cigarettes or if you have certain chronic medical conditions.  Tetanus, diphtheria, pertussis (Tdap, Td) immunization. / A one-time dose of Tdap vaccine. After that, you need a Td booster dose every 10 years.  HPV immunization. / 3 doses over 6 months, if you are 26 and younger.  Measles, mumps, rubella (MMR) immunization. / You need at least 1 dose of MMR if you were born in 1957 or later. You may also need a second dose.  Meningococcal immunization. / 1 dose if you are age 19 to 21 and a first-year college student living in a residence hall, or have one of several medical conditions, you need to get vaccinated against meningococcal disease. You may also need additional booster doses.  Varicella immunization.** / Consult your caregiver.  Hepatitis A immunization.** / Consult your caregiver. 2 doses, 6 to 18 months apart.  Hepatitis B immunization.** / Consult your caregiver. 3 doses usually over 6 months. Ages 40 to 64  Blood pressure check.** / Every 1 to 2 years.  Lipid and cholesterol check.** / Every 5 years beginning at age 20.  Clinical breast exam.** / Every year after age 40.  Mammogram.** / Every year beginning at age 40   and continuing for as long as you are in good health. Consult with your  caregiver.  Pap test.** / Every 3 years starting at age 30 through age 65 or 70 with a history of 3 consecutive normal Pap tests.  HPV screening.** / Every 3 years from ages 30 through ages 65 to 70 with a history of 3 consecutive normal Pap tests.  Fecal occult blood test (FOBT) of stool. / Every year beginning at age 50 and continuing until age 75. You may not need to do this test if you get a colonoscopy every 10 years.  Flexible sigmoidoscopy or colonoscopy.** / Every 5 years for a flexible sigmoidoscopy or every 10 years for a colonoscopy beginning at age 50 and continuing until age 75.  Hepatitis C blood test.** / For all people born from 1945 through 1965 and any individual with known risks for hepatitis C.  Skin self-exam. / Monthly.  Influenza immunization.** / Every year.  Pneumococcal polysaccharide immunization.** / 1 to 2 doses if you smoke cigarettes or if you have certain chronic medical conditions.  Tetanus, diphtheria, pertussis (Tdap, Td) immunization.** / A one-time dose of Tdap vaccine. After that, you need a Td booster dose every 10 years.  Measles, mumps, rubella (MMR) immunization. / You need at least 1 dose of MMR if you were born in 1957 or later. You may also need a second dose.  Varicella immunization.** / Consult your caregiver.  Meningococcal immunization.** / Consult your caregiver.  Hepatitis A immunization.** / Consult your caregiver. 2 doses, 6 to 18 months apart.  Hepatitis B immunization.** / Consult your caregiver. 3 doses, usually over 6 months. Ages 65 and over  Blood pressure check.** / Every 1 to 2 years.  Lipid and cholesterol check.** / Every 5 years beginning at age 20.  Clinical breast exam.** / Every year after age 40.  Mammogram.** / Every year beginning at age 40 and continuing for as long as you are in good health. Consult with your caregiver.  Pap test.** / Every 3 years starting at age 30 through age 65 or 70 with a 3  consecutive normal Pap tests. Testing can be stopped between 65 and 70 with 3 consecutive normal Pap tests and no abnormal Pap or HPV tests in the past 10 years.  HPV screening.** / Every 3 years from ages 30 through ages 65 or 70 with a history of 3 consecutive normal Pap tests. Testing can be stopped between 65 and 70 with 3 consecutive normal Pap tests and no abnormal Pap or HPV tests in the past 10 years.  Fecal occult blood test (FOBT) of stool. / Every year beginning at age 50 and continuing until age 75. You may not need to do this test if you get a colonoscopy every 10 years.  Flexible sigmoidoscopy or colonoscopy.** / Every 5 years for a flexible sigmoidoscopy or every 10 years for a colonoscopy beginning at age 50 and continuing until age 75.  Hepatitis C blood test.** / For all people born from 1945 through 1965 and any individual with known risks for hepatitis C.  Osteoporosis screening.** / A one-time screening for women ages 65 and over and women at risk for fractures or osteoporosis.  Skin self-exam. / Monthly.  Influenza immunization.** / Every year.  Pneumococcal polysaccharide immunization.** / 1 dose at age 65 (or older) if you have never been vaccinated.  Tetanus, diphtheria, pertussis (Tdap, Td) immunization. / A one-time dose of Tdap vaccine if you are over   65 and have contact with an infant, are a healthcare worker, or simply want to be protected from whooping cough. After that, you need a Td booster dose every 10 years.  Varicella immunization.** / Consult your caregiver.  Meningococcal immunization.** / Consult your caregiver.  Hepatitis A immunization.** / Consult your caregiver. 2 doses, 6 to 18 months apart.  Hepatitis B immunization.** / Check with your caregiver. 3 doses, usually over 6 months. ** Family history and personal history of risk and conditions may change your caregiver's recommendations. Document Released: 01/23/2002 Document Revised: 02/19/2012  Document Reviewed: 04/24/2011 [employer] Patient Information 2013 [employer].  

## 2013-05-04 ENCOUNTER — Other Ambulatory Visit: Payer: Self-pay | Admitting: Family Medicine

## 2013-06-17 ENCOUNTER — Other Ambulatory Visit: Payer: Self-pay | Admitting: Family Medicine

## 2013-07-17 ENCOUNTER — Other Ambulatory Visit: Payer: Self-pay | Admitting: Family Medicine

## 2013-08-15 ENCOUNTER — Ambulatory Visit (INDEPENDENT_AMBULATORY_CARE_PROVIDER_SITE_OTHER): Payer: BC Managed Care – PPO | Admitting: Family Medicine

## 2013-08-15 ENCOUNTER — Encounter: Payer: Self-pay | Admitting: Family Medicine

## 2013-08-15 VITALS — BP 136/88 | HR 101 | Ht 68.0 in | Wt 154.0 lb

## 2013-08-15 DIAGNOSIS — G4700 Insomnia, unspecified: Secondary | ICD-10-CM

## 2013-08-15 DIAGNOSIS — Z01812 Encounter for preprocedural laboratory examination: Secondary | ICD-10-CM

## 2013-08-15 DIAGNOSIS — Z30430 Encounter for insertion of intrauterine contraceptive device: Secondary | ICD-10-CM

## 2013-08-15 DIAGNOSIS — N920 Excessive and frequent menstruation with regular cycle: Secondary | ICD-10-CM

## 2013-08-15 LAB — POCT URINE PREGNANCY: Preg Test, Ur: NEGATIVE

## 2013-08-15 MED ORDER — DOXEPIN HCL 10 MG PO CAPS: 10.0000 mg | ORAL_CAPSULE | Freq: Every day | ORAL | Status: DC

## 2013-08-15 NOTE — Progress Notes (Signed)
  Subjective:    Patient ID: Rebecca Roman, female    DOB: 03/10/1966, 47 y.o.   MRN: [id number]  HPI  Reports heavy cycles.  Not sexually active.  Bleeding is regular but much heavier and associated with a lot of cramping.  She is bleeding for 7 days.  She would like something for cycle control like an IUD that she does not have to think about.  Also complains of insomnia.  Scared to try anything too strong.  Long standing problem, but needs something to help her sleep.  Review of Systems  Constitutional: Negative for fever and chills.  HENT: Negative for congestion and rhinorrhea.   Respiratory: Negative for cough and shortness of breath.   Cardiovascular: Negative for chest pain and leg swelling.  Gastrointestinal: Negative for abdominal pain.  Genitourinary: Positive for vaginal bleeding and menstrual problem.       Objective:   Physical Exam  Vitals reviewed. Constitutional: She appears well-developed and well-nourished.  HENT:  Head: Normocephalic and atraumatic.  Eyes: No scleral icterus.  Cardiovascular: Normal rate.   Pulmonary/Chest: Effort normal.  Abdominal: Soft.  Genitourinary: Vagina normal and uterus normal.   Procedure: Patient given informed consent, signed copy in the chart, time out was performed. Appropriate time out taken. . The patient was placed in the lithotomy position and the cervix brought into view with sterile speculum.  Portio of cervix cleansed x 2 with betadine swabs.  A tenaculum was placed in the anterior lip of the cervix.  The uterus was sounded for depth of 10 cm. A pipelle was introduced to into the uterus, suction created,  and an endometrial sample was obtained. All equipment was removed and accounted for.  The patient tolerated the procedure well.   Procedure: Patient identified, informed consent performed, signed copy in chart, time out was performed.  Urine pregnancy test negative.  Speculum placed in the vagina.  Cervix visualized.   Cleaned with Betadine x 2.  Grasped anteriourly with a single tooth tenaculum.  Uterus sounded to 10 cm.  Mirena IUD placed per manufacturer's recommendations.  Strings trimmed to 3 cm.      Assessment & Plan:

## 2013-08-15 NOTE — Assessment & Plan Note (Signed)
Needs EMB done today--trial of Mirena for cycle control

## 2013-08-15 NOTE — Assessment & Plan Note (Signed)
Trial of Doxepin--advised to try 1/2 to 1/4 tablet--side effects reviewed.

## 2013-08-15 NOTE — Patient Instructions (Addendum)
Levonorgestrel intrauterine device (IUD) What is this medicine? LEVONORGESTREL IUD (LEE voe nor jes trel) is a contraceptive (birth control) device. The device is placed inside the uterus by a healthcare professional. It is used to prevent pregnancy and can also be used to treat heavy bleeding that occurs during your period. Depending on the device, it can be used for 3 to 5 years. This medicine may be used for other purposes; ask your health care provider or pharmacist if you have questions. What should I tell my health care provider before I take this medicine? They need to know if you have any of these conditions: -abnormal Pap smear -cancer of the breast, uterus, or cervix -diabetes -endometritis -genital or pelvic infection now or in the past -have more than one sexual partner or your partner has more than one partner -heart disease -history of an ectopic or tubal pregnancy -immune system problems -IUD in place -liver disease or tumor -problems with blood clots or take blood-thinners -use intravenous drugs -uterus of unusual shape -vaginal bleeding that has not been explained -an unusual or allergic reaction to levonorgestrel, other hormones, silicone, or polyethylene, medicines, foods, dyes, or preservatives -pregnant or trying to get pregnant -breast-feeding How should I use this medicine? This device is placed inside the uterus by a health care professional. Talk to your pediatrician regarding the use of this medicine in children. Special care may be needed. Overdosage: If you think you have taken too much of this medicine contact a poison control center or emergency room at once. NOTE: This medicine is only for you. Do not share this medicine with others. What if I miss a dose? This does not apply. What may interact with this medicine? Do not take this medicine with any of the following medications: -amprenavir -bosentan -fosamprenavir This medicine may also interact with  the following medications: -aprepitant -barbiturate medicines for inducing sleep or treating seizures -bexarotene -griseofulvin -medicines to treat seizures like carbamazepine, ethotoin, felbamate, oxcarbazepine, phenytoin, topiramate -modafinil -pioglitazone -rifabutin -rifampin -rifapentine -some medicines to treat HIV infection like atazanavir, indinavir, lopinavir, nelfinavir, tipranavir, ritonavir -St. John's wort -warfarin This list may not describe all possible interactions. Give your health care provider a list of all the medicines, herbs, non-prescription drugs, or dietary supplements you use. Also tell them if you smoke, drink alcohol, or use illegal drugs. Some items may interact with your medicine. What should I watch for while using this medicine? Visit your doctor or health care professional for regular check ups. See your doctor if you or your partner has sexual contact with others, becomes HIV positive, or gets a sexual transmitted disease. This product does not protect you against HIV infection (AIDS) or other sexually transmitted diseases. You can check the placement of the IUD yourself by reaching up to the top of your vagina with clean fingers to feel the threads. Do not pull on the threads. It is a good habit to check placement after each menstrual period. Call your doctor right away if you feel more of the IUD than just the threads or if you cannot feel the threads at all. The IUD may come out by itself. You may become pregnant if the device comes out. If you notice that the IUD has come out use a backup birth control method like condoms and call your health care provider. Using tampons will not change the position of the IUD and are okay to use during your period. What side effects may I notice from receiving this medicine?   Side effects that you should report to your doctor or health care professional as soon as possible: -allergic reactions like skin rash, itching or  hives, swelling of the face, lips, or tongue -fever, flu-like symptoms -genital sores -high blood pressure -no menstrual period for 6 weeks during use -pain, swelling, warmth in the leg -pelvic pain or tenderness -severe or sudden headache -signs of pregnancy -stomach cramping -sudden shortness of breath -trouble with balance, talking, or walking -unusual vaginal bleeding, discharge -yellowing of the eyes or skin Side effects that usually do not require medical attention (report to your doctor or health care professional if they continue or are bothersome): -acne -breast pain -change in sex drive or performance -changes in weight -cramping, dizziness, or faintness while the device is being inserted -headache -irregular menstrual bleeding within first 3 to 6 months of use -nausea This list may not describe all possible side effects. Call your doctor for medical advice about side effects. You may report side effects to FDA at 1-800-FDA-1088. Where should I keep my medicine? This does not apply. NOTE: This sheet is a summary. It may not cover all possible information. If you have questions about this medicine, talk to your doctor, pharmacist, or health care provider.  2013, [employer] Standard. (12/28/2011 1:54:04 PM) Levonorgestrel intrauterine device (IUD) What is this medicine? LEVONORGESTREL IUD (LEE voe nor jes trel) is a contraceptive (birth control) device. The device is placed inside the uterus by a healthcare professional. It is used to prevent pregnancy and can also be used to treat heavy bleeding that occurs during your period. Depending on the device, it can be used for 3 to 5 years. This medicine may be used for other purposes; ask your health care provider or pharmacist if you have questions. What should I tell my health care provider before I take this medicine? They need to know if you have any of these conditions: -abnormal Pap smear -cancer of the breast, uterus,  or cervix -diabetes -endometritis -genital or pelvic infection now or in the past -have more than one sexual partner or your partner has more than one partner -heart disease -history of an ectopic or tubal pregnancy -immune system problems -IUD in place -liver disease or tumor -problems with blood clots or take blood-thinners -use intravenous drugs -uterus of unusual shape -vaginal bleeding that has not been explained -an unusual or allergic reaction to levonorgestrel, other hormones, silicone, or polyethylene, medicines, foods, dyes, or preservatives -pregnant or trying to get pregnant -breast-feeding How should I use this medicine? This device is placed inside the uterus by a health care professional. Talk to your pediatrician regarding the use of this medicine in children. Special care may be needed. Overdosage: If you think you have taken too much of this medicine contact a poison control center or emergency room at once. NOTE: This medicine is only for you. Do not share this medicine with others. What if I miss a dose? This does not apply. What may interact with this medicine? Do not take this medicine with any of the following medications: -amprenavir -bosentan -fosamprenavir This medicine may also interact with the following medications: -aprepitant -barbiturate medicines for inducing sleep or treating seizures -bexarotene -griseofulvin -medicines to treat seizures like carbamazepine, ethotoin, felbamate, oxcarbazepine, phenytoin, topiramate -modafinil -pioglitazone -rifabutin -rifampin -rifapentine -some medicines to treat HIV infection like atazanavir, indinavir, lopinavir, nelfinavir, tipranavir, ritonavir -St. John's wort -warfarin This list may not describe all possible interactions. Give your health care provider a list of all the medicines,  herbs, non-prescription drugs, or dietary supplements you use. Also tell them if you smoke, drink alcohol, or use illegal  drugs. Some items may interact with your medicine. What should I watch for while using this medicine? Visit your doctor or health care professional for regular check ups. See your doctor if you or your partner has sexual contact with others, becomes HIV positive, or gets a sexual transmitted disease. This product does not protect you against HIV infection (AIDS) or other sexually transmitted diseases. You can check the placement of the IUD yourself by reaching up to the top of your vagina with clean fingers to feel the threads. Do not pull on the threads. It is a good habit to check placement after each menstrual period. Call your doctor right away if you feel more of the IUD than just the threads or if you cannot feel the threads at all. The IUD may come out by itself. You may become pregnant if the device comes out. If you notice that the IUD has come out use a backup birth control method like condoms and call your health care provider. Using tampons will not change the position of the IUD and are okay to use during your period. What side effects may I notice from receiving this medicine? Side effects that you should report to your doctor or health care professional as soon as possible: -allergic reactions like skin rash, itching or hives, swelling of the face, lips, or tongue -fever, flu-like symptoms -genital sores -high blood pressure -no menstrual period for 6 weeks during use -pain, swelling, warmth in the leg -pelvic pain or tenderness -severe or sudden headache -signs of pregnancy -stomach cramping -sudden shortness of breath -trouble with balance, talking, or walking -unusual vaginal bleeding, discharge -yellowing of the eyes or skin Side effects that usually do not require medical attention (report to your doctor or health care professional if they continue or are bothersome): -acne -breast pain -change in sex drive or performance -changes in weight -cramping, dizziness, or  faintness while the device is being inserted -headache -irregular menstrual bleeding within first 3 to 6 months of use -nausea This list may not describe all possible side effects. Call your doctor for medical advice about side effects. You may report side effects to FDA at 1-800-FDA-1088. Where should I keep my medicine? This does not apply. NOTE: This sheet is a summary. It may not cover all possible information. If you have questions about this medicine, talk to your doctor, pharmacist, or health care provider.  2013, [employer]/Gold Standard. (12/28/2011 1:54:04 PM) Insomnia Insomnia is frequent trouble falling and/or staying asleep. Insomnia can be a long term problem or a short term problem. Both are common. Insomnia can be a short term problem when the wakefulness is related to a certain stress or worry. Long term insomnia is often related to ongoing stress during waking hours and/or poor sleeping habits. Overtime, sleep deprivation itself can make the problem worse. Every little thing feels more severe because you are overtired and your ability to cope is decreased. CAUSES   Stress, anxiety, and depression.  Poor sleeping habits.  Distractions such as TV in the bedroom.  Naps close to bedtime.  Engaging in emotionally charged conversations before bed.  Technical reading before sleep.  Alcohol and other sedatives. They may make the problem worse. They can hurt normal sleep patterns and normal dream activity.  Stimulants such as caffeine for several hours prior to bedtime.  Pain syndromes and shortness of breath can  cause insomnia.  Exercise late at night.  Changing time zones may cause sleeping problems (jet lag). It is sometimes helpful to have someone observe your sleeping patterns. They should look for periods of not breathing during the night (sleep apnea). They should also look to see how long those periods last. If you live alone or observers are uncertain, you can also  be observed at a sleep clinic where your sleep patterns will be professionally monitored. Sleep apnea requires a checkup and treatment. Give your caregivers your medical history. Give your caregivers observations your family has made about your sleep.  SYMPTOMS   Not feeling rested in the morning.  Anxiety and restlessness at bedtime.  Difficulty falling and staying asleep. TREATMENT   Your caregiver may prescribe treatment for an underlying medical disorders. Your caregiver can give advice or help if you are using alcohol or other drugs for self-medication. Treatment of underlying problems will usually eliminate insomnia problems.  Medications can be prescribed for short time use. They are generally not recommended for lengthy use.  Over-the-counter sleep medicines are not recommended for lengthy use. They can be habit forming.  You can promote easier sleeping by making lifestyle changes such as:  Using relaxation techniques that help with breathing and reduce muscle tension.  Exercising earlier in the day.  Changing your diet and the time of your last meal. No night time snacks.  Establish a regular time to go to bed.  Counseling can help with stressful problems and worry.  Soothing music and white noise may be helpful if there are background noises you cannot remove.  Stop tedious detailed work at least one hour before bedtime. HOME CARE INSTRUCTIONS   Keep a diary. Inform your caregiver about your progress. This includes any medication side effects. See your caregiver regularly. Take note of:  Times when you are asleep.  Times when you are awake during the night.  The quality of your sleep.  How you feel the next day. This information will help your caregiver care for you.  Get out of bed if you are still awake after 15 minutes. Read or do some quiet activity. Keep the lights down. Wait until you feel sleepy and go back to bed.  Keep regular sleeping and waking  hours. Avoid naps.  Exercise regularly.  Avoid distractions at bedtime. Distractions include watching television or engaging in any intense or detailed activity like attempting to balance the household checkbook.  Develop a bedtime ritual. Keep a familiar routine of bathing, brushing your teeth, climbing into bed at the same time each night, listening to soothing music. Routines increase the success of falling to sleep faster.  Use relaxation techniques. This can be using breathing and muscle tension release routines. It can also include visualizing peaceful scenes. You can also help control troubling or intruding thoughts by keeping your mind occupied with boring or repetitive thoughts like the old concept of counting sheep. You can make it more creative like imagining planting one beautiful flower after another in your backyard garden.  During your day, work to eliminate stress. When this is not possible use some of the previous suggestions to help reduce the anxiety that accompanies stressful situations. MAKE SURE YOU:   Understand these instructions.  Will watch your condition.  Will get help right away if you are not doing well or get worse. Document Released: 11/24/2000 Document Revised: 02/19/2012 Document Reviewed: 12/25/2007 [facility] Patient Information 2014 [place], [place].

## 2013-09-09 ENCOUNTER — Telehealth: Payer: Self-pay | Admitting: *Deleted

## 2013-09-09 DIAGNOSIS — J302 Other seasonal allergic rhinitis: Secondary | ICD-10-CM

## 2013-09-09 MED ORDER — FEXOFENADINE-PSEUDOEPHED ER 180-240 MG PO TB24: 1.0000 | ORAL_TABLET | Freq: Every day | ORAL | Status: DC

## 2013-09-09 NOTE — Telephone Encounter (Signed)
Patient is requesting a refill of her Allegra D.

## 2013-09-26 ENCOUNTER — Ambulatory Visit (INDEPENDENT_AMBULATORY_CARE_PROVIDER_SITE_OTHER): Payer: BC Managed Care – PPO | Admitting: Family Medicine

## 2013-09-26 ENCOUNTER — Encounter: Payer: Self-pay | Admitting: Family Medicine

## 2013-09-26 VITALS — BP 124/88 | HR 88 | Ht 68.0 in | Wt 152.0 lb

## 2013-09-26 DIAGNOSIS — Z1239 Encounter for other screening for malignant neoplasm of breast: Secondary | ICD-10-CM

## 2013-09-26 DIAGNOSIS — N840 Polyp of corpus uteri: Secondary | ICD-10-CM

## 2013-09-26 NOTE — Patient Instructions (Signed)

## 2013-09-27 DIAGNOSIS — N840 Polyp of corpus uteri: Secondary | ICD-10-CM | POA: Insufficient documentation

## 2013-09-27 NOTE — Progress Notes (Signed)
  Subjective:    Patient ID: Rebecca Roman, female    DOB: [birth year], 47 y.o.   MRN: [id number]  HPI  S/P IUD insertion and EMB--which showed small polyp.  Reports some irregular bleeding, but not as heavy. States she can live with it.  She is not interested in trial of pack of pills at this point.  May need  D and C for benign polyp noted on EMB if bleeding fails to improve.  Review of Systems  Constitutional: Negative for fever and chills.  HENT: Negative for rhinorrhea.   Respiratory: Negative for shortness of breath.   Gastrointestinal: Negative for abdominal pain.  Genitourinary: Positive for menstrual problem. Negative for dysuria.       Objective:   Physical Exam  Vitals reviewed. Constitutional: She is oriented to person, place, and time. She appears well-developed and well-nourished.  HENT:  Head: Normocephalic and atraumatic.  Cardiovascular: Normal rate.   Pulmonary/Chest: Effort normal.  Abdominal: Soft. There is no tenderness.  Genitourinary: Vagina normal and uterus normal.  IUD strings are WNL  Neurological: She is alert and oriented to person, place, and time.  Skin: Skin is warm and dry.          Assessment & Plan:

## 2013-10-17 ENCOUNTER — Encounter: Payer: Self-pay | Admitting: Obstetrics & Gynecology

## 2013-10-17 ENCOUNTER — Ambulatory Visit (INDEPENDENT_AMBULATORY_CARE_PROVIDER_SITE_OTHER): Payer: BC Managed Care – PPO | Admitting: Obstetrics & Gynecology

## 2013-10-17 VITALS — BP 127/92 | HR 86 | Ht 68.0 in | Wt 153.0 lb

## 2013-10-17 DIAGNOSIS — Z30431 Encounter for routine checking of intrauterine contraceptive device: Secondary | ICD-10-CM

## 2013-10-17 DIAGNOSIS — N920 Excessive and frequent menstruation with regular cycle: Secondary | ICD-10-CM

## 2013-10-17 NOTE — Progress Notes (Signed)
  Subjective:    Patient ID: Rebecca Roman, female    DOB: 1966-08-12, 47 y.o.   MRN: [id number]  HPI  47 yo single W P2( 47yo Cornelius Moras and 46 yo AshliP who is here today for a IUD check. She had the IUD put in 2 months ago to treat menorrhagia. She uses abstinence for birth control. She reports that the amount of bleeding since the IUD is almost nothing. She is please with this. However, she is concerned because she accidentally pulled the strings when removing a tampon.  Review of Systems Mammogram scheduled for this month She received the flu vaccine 10/14. Pap smear normal 12/13    Objective:   Physical Exam  Gyn u/s shows the IUD in place I did trim the long stirngs      Assessment & Plan:  Menorrhagia- continue Mirena Preventative- schedule annual  Fasting blood work at her next visit

## 2013-11-10 ENCOUNTER — Telehealth: Payer: Self-pay | Admitting: *Deleted

## 2013-11-10 DIAGNOSIS — B379 Candidiasis, unspecified: Secondary | ICD-10-CM

## 2013-11-10 MED ORDER — FLUCONAZOLE 150 MG PO TABS: 150.0000 mg | ORAL_TABLET | Freq: Every day | ORAL | Status: DC

## 2013-11-10 NOTE — Telephone Encounter (Signed)
Patient is requesting a refill for Diflucan for a yeast infection.

## 2013-11-18 ENCOUNTER — Encounter: Payer: Self-pay | Admitting: Obstetrics & Gynecology

## 2013-11-18 ENCOUNTER — Ambulatory Visit (INDEPENDENT_AMBULATORY_CARE_PROVIDER_SITE_OTHER): Payer: BC Managed Care – PPO | Admitting: Obstetrics & Gynecology

## 2013-11-18 VITALS — BP 125/86 | HR 84 | Ht 68.0 in | Wt 157.0 lb

## 2013-11-18 DIAGNOSIS — N898 Other specified noninflammatory disorders of vagina: Secondary | ICD-10-CM

## 2013-11-18 DIAGNOSIS — L293 Anogenital pruritus, unspecified: Secondary | ICD-10-CM

## 2013-11-18 DIAGNOSIS — Z0000 Encounter for general adult medical examination without abnormal findings: Secondary | ICD-10-CM

## 2013-11-18 DIAGNOSIS — R300 Dysuria: Secondary | ICD-10-CM

## 2013-11-18 DIAGNOSIS — Z1151 Encounter for screening for human papillomavirus (HPV): Secondary | ICD-10-CM

## 2013-11-18 DIAGNOSIS — Z01419 Encounter for gynecological examination (general) (routine) without abnormal findings: Secondary | ICD-10-CM

## 2013-11-18 DIAGNOSIS — Z124 Encounter for screening for malignant neoplasm of cervix: Secondary | ICD-10-CM

## 2013-11-18 LAB — LIPID PANEL
HDL: 59 mg/dL (ref >39)
LDL Cholesterol: 108 mg/dL — ABNORMAL HIGH (ref 0–99)
Total CHOL/HDL Ratio: 3.2 Ratio

## 2013-11-18 LAB — COMPREHENSIVE METABOLIC PANEL
ALT: 8 U/L (ref 0–35)
AST: 13 U/L (ref 0–37)
Albumin: 4.2 g/dL (ref 3.5–5.2)
Alkaline Phosphatase: 78 U/L (ref 39–117)
BUN: 15 mg/dL (ref 6–23)
Creat: 0.85 mg/dL (ref 0.50–1.10)
Potassium: 4.3 mEq/L (ref 3.5–5.3)

## 2013-11-18 LAB — CBC
HCT: 43.5 % (ref 36.0–46.0)
MCHC: 33.3 g/dL (ref 30.0–36.0)
Platelets: 348 10*3/uL (ref 150–400)
RDW: 13.3 % (ref 11.5–15.5)
WBC: 5.8 10*3/uL (ref 4.0–10.5)

## 2013-11-18 NOTE — Progress Notes (Signed)
Subjective:    Rebecca Roman is a 47 y.o. female who presents for an annual exam. The patient has no complaints today except some vaginal itching. She took a Diflucan last week with some relief. She had 2 days of dysuria last weeks.The patient is not currently sexually active. GYN screening history: last pap: was normal. The patient wears seatbelts: yes. The patient participates in regular exercise: yes. Has the patient ever been transfused or tattooed?: no. The patient reports that there is not domestic violence in her life.   Menstrual History: OB History   Grav Para Term Preterm Abortions TAB SAB Ect Mult Living   3 2 2       2       Menarche age: 79 [place]: 62   Patient's last menstrual period was 11/11/2013.    The following portions of the patient's history were reviewed and updated as appropriate: allergies, current medications, past family history, past medical history, past social history, past surgical history and problem list.  Review of Systems A comprehensive review of systems was negative.  Her mammogram was normal last month. She lives with her 108 yo daughter and 2 yo son. She quit breastfeeding 8/14.   Objective:    BP 125/86  Pulse 84  Ht 5\' 8"  (1.727 m)  Wt 157 lb (71.215 kg)  BMI 23.88 kg/m2  LMP 11/11/2013  General Appearance:    Alert, cooperative, no distress, appears stated age  Head:    Normocephalic, without obvious abnormality, atraumatic  Eyes:    PERRL, conjunctiva/corneas clear, EOM's intact, fundi    benign, both eyes  Ears:    Normal TM's and external ear canals, both ears  Nose:   Nares normal, septum midline, mucosa normal, no drainage    or sinus tenderness  Throat:   Lips, mucosa, and tongue normal; teeth and gums normal  Neck:   Supple, symmetrical, trachea midline, no adenopathy;    thyroid:  no enlargement/tenderness/nodules; no carotid   bruit or JVD  Back:     Symmetric, no curvature, ROM normal, no CVA tenderness  Lungs:     Clear to  auscultation bilaterally, respirations unlabored  Chest Wall:    No tenderness or deformity   Heart:    Regular rate and rhythm, S1 and S2 normal, no murmur, rub   or gallop  Breast Exam:    No tenderness, masses, or nipple abnormality  Abdomen:     Soft, non-tender, bowel sounds active all four quadrants,    no masses, no organomegaly  Genitalia:    Normal female without lesion, discharge or tenderness, normal/no vaginal discharge, NSSA, NT, mobile, normal adnexal exam     Extremities:   Extremities normal, atraumatic, no cyanosis or edema  Pulses:   2+ and symmetric all extremities  Skin:   Skin color, texture, turgor normal, no rashes or lesions  Lymph nodes:   Cervical, supraclavicular, and axillary nodes normal  Neurologic:   CNII-XII intact, normal strength, sensation and reflexes    throughout  .    Assessment:    Healthy female exam.    Plan:     Breast self exam technique reviewed and patient encouraged to perform self-exam monthly. Thin prep Pap smear. with cotesting Urine culture Wet prep sent Fasting labs

## 2013-11-18 NOTE — Progress Notes (Deleted)
Subjective:    Rebecca Roman is a 47 y.o. female who presents for an annual exam. The patient has no complaints today. Bleeding with Mirena is improving as of recently. She did have some dysuria recently. The patient is not currently sexually active. GYN screening history: last pap: was normal. The patient wears seatbelts: yes. The patient participates in regular exercise: yes. Has the patient ever been transfused or tattooed?: no. The patient reports that there is not domestic violence in her life.   Menstrual History: OB History   Grav Para Term Preterm Abortions TAB SAB Ect Mult Living   3 2 2       2       Menarche age: 2 [place]: 74   Patient's last menstrual period was 11/11/2013.    The following portions of the patient's history were reviewed and updated as appropriate: allergies, current medications, past family history, past medical history, past social history, past surgical history and problem list.  Review of Systems A comprehensive review of systems was negative. Works at [employer] ([employer] Abstinent for 3 years, lives with her 69 yo daughter and her baby son. Quit breast feeding 8/14 at 3 yo.   Objective:    {exam; complete:18323}.    Assessment:    Healthy female exam.    Plan:     {gyn plan:13146}

## 2013-11-19 LAB — WET PREP, GENITAL
Clue Cells Wet Prep HPF POC: NONE SEEN
Trich, Wet Prep: NONE SEEN
Yeast Wet Prep HPF POC: NONE SEEN

## 2013-11-19 LAB — TSH: TSH: 3.686 u[IU]/mL (ref 0.350–4.500)

## 2013-11-20 LAB — URINE CULTURE

## 2014-01-15 ENCOUNTER — Other Ambulatory Visit: Payer: Self-pay | Admitting: Family Medicine

## 2014-01-19 ENCOUNTER — Telehealth: Payer: Self-pay | Admitting: *Deleted

## 2014-01-19 DIAGNOSIS — Z9109 Other allergy status, other than to drugs and biological substances: Secondary | ICD-10-CM

## 2014-01-19 MED ORDER — FLUTICASONE PROPIONATE 50 MCG/ACT NA SUSP: NASAL | Status: DC

## 2014-01-19 NOTE — Telephone Encounter (Signed)
Patient needs flonase called in as a 3 month supply instead of one month for cost savings.

## 2014-01-27 ENCOUNTER — Encounter: Payer: Self-pay | Admitting: Family Medicine

## 2014-05-18 ENCOUNTER — Other Ambulatory Visit: Payer: Self-pay | Admitting: Family Medicine

## 2014-06-09 ENCOUNTER — Encounter: Payer: Self-pay | Admitting: Family Medicine

## 2014-06-09 ENCOUNTER — Ambulatory Visit (INDEPENDENT_AMBULATORY_CARE_PROVIDER_SITE_OTHER): Payer: BC Managed Care – PPO | Admitting: Family Medicine

## 2014-06-09 VITALS — BP 118/86 | HR 91 | Ht 68.0 in | Wt 157.0 lb

## 2014-06-09 DIAGNOSIS — G4700 Insomnia, unspecified: Secondary | ICD-10-CM

## 2014-06-09 MED ORDER — ZOLPIDEM TARTRATE 10 MG PO TABS: 10.0000 mg | ORAL_TABLET | Freq: Every evening | ORAL | Status: DC | PRN

## 2014-06-09 NOTE — Patient Instructions (Signed)
Insomnia Insomnia is frequent trouble falling and/or staying asleep. Insomnia can be a long term problem or a short term problem. Both are common. Insomnia can be a short term problem when the wakefulness is related to a certain stress or worry. Long term insomnia is often related to ongoing stress during waking hours and/or poor sleeping habits. Overtime, sleep deprivation itself can make the problem worse. Every little thing feels more severe because you are overtired and your ability to cope is decreased. CAUSES   Stress, anxiety, and depression.  Poor sleeping habits.  Distractions such as TV in the bedroom.  Naps close to bedtime.  Engaging in emotionally charged conversations before bed.  Technical reading before sleep.  Alcohol and other sedatives. They may make the problem worse. They can hurt normal sleep patterns and normal dream activity.  Stimulants such as caffeine for several hours prior to bedtime.  Pain syndromes and shortness of breath can cause insomnia.  Exercise late at night.  Changing time zones may cause sleeping problems (jet lag). It is sometimes helpful to have someone observe your sleeping patterns. They should look for periods of not breathing during the night (sleep apnea). They should also look to see how long those periods last. If you live alone or observers are uncertain, you can also be observed at a sleep clinic where your sleep patterns will be professionally monitored. Sleep apnea requires a checkup and treatment. Give your caregivers your medical history. Give your caregivers observations your family has made about your sleep.  SYMPTOMS   Not feeling rested in the morning.  Anxiety and restlessness at bedtime.  Difficulty falling and staying asleep. TREATMENT   Your caregiver may prescribe treatment for an underlying medical disorders. Your caregiver can give advice or help if you are using alcohol or other drugs for self-medication. Treatment  of underlying problems will usually eliminate insomnia problems.  Medications can be prescribed for short time use. They are generally not recommended for lengthy use.  Over-the-counter sleep medicines are not recommended for lengthy use. They can be habit forming.  You can promote easier sleeping by making lifestyle changes such as:  Using relaxation techniques that help with breathing and reduce muscle tension.  Exercising earlier in the day.  Changing your diet and the time of your last meal. No night time snacks.  Establish a regular time to go to bed.  Counseling can help with stressful problems and worry.  Soothing music and white noise may be helpful if there are background noises you cannot remove.  Stop tedious detailed work at least one hour before bedtime. HOME CARE INSTRUCTIONS   Keep a diary. Inform your caregiver about your progress. This includes any medication side effects. See your caregiver regularly. Take note of:  Times when you are asleep.  Times when you are awake during the night.  The quality of your sleep.  How you feel the next day. This information will help your caregiver care for you.  Get out of bed if you are still awake after 15 minutes. Read or do some quiet activity. Keep the lights down. Wait until you feel sleepy and go back to bed.  Keep regular sleeping and waking hours. Avoid naps.  Exercise regularly.  Avoid distractions at bedtime. Distractions include watching television or engaging in any intense or detailed activity like attempting to balance the household checkbook.  Develop a bedtime ritual. Keep a familiar routine of bathing, brushing your teeth, climbing into bed at the same   time each night, listening to soothing music. Routines increase the success of falling to sleep faster.  Use relaxation techniques. This can be using breathing and muscle tension release routines. It can also include visualizing peaceful scenes. You can  also help control troubling or intruding thoughts by keeping your mind occupied with boring or repetitive thoughts like the old concept of counting sheep. You can make it more creative like imagining planting one beautiful flower after another in your backyard garden.  During your day, work to eliminate stress. When this is not possible use some of the previous suggestions to help reduce the anxiety that accompanies stressful situations. MAKE SURE YOU:   Understand these instructions.  Will watch your condition.  Will get help right away if you are not doing well or get worse. Document Released: 11/24/2000 Document Revised: 02/19/2012 Document Reviewed: 12/25/2007 [employer] Patient Information 2015 [employer]. This information is not intended to replace advice given to you by your health care provider. Make sure you discuss any questions you have with your health care provider.  

## 2014-06-09 NOTE — Assessment & Plan Note (Signed)
Resume regular exercise and trial of Ambien for prn nights with difficulty falling asleep.

## 2014-06-09 NOTE — Progress Notes (Signed)
    Subjective:    Patient ID: Rebecca Roman is a 48 y.o. female presenting with Advice Only  on 06/09/2014  HPI:  Still with some insomnia.  Has failed doxepin in the past.  Sleeps better with exercise.  No caffeine in the afternoon.  1 glass of alcohol.  New mattress, darkened room. Has trouble falling and staying asleep. Previous trial of Ambien.  Review of Systems  Constitutional: Negative for fever and chills.  Respiratory: Negative for shortness of breath.   Cardiovascular: Negative for chest pain.  Gastrointestinal: Negative for abdominal pain.      Objective:    BP 118/86  Pulse 91  Ht 5\' 8"  (1.727 m)  Wt 157 lb (71.215 kg)  BMI 23.88 kg/m2  LMP 05/11/2014 Physical Exam  Constitutional: She appears well-developed and well-nourished. No distress.  Cardiovascular: Normal rate.   Pulmonary/Chest: Effort normal.  Abdominal: Soft. There is no tenderness.  Neurological: She is alert.  Skin: Skin is warm.        Assessment & Plan:  Insomnia Resume regular exercise and trial of Ambien for prn nights with difficulty falling asleep.     Return in about 4 weeks (around 07/07/2014) for a follow-up.

## 2014-07-09 ENCOUNTER — Ambulatory Visit (INDEPENDENT_AMBULATORY_CARE_PROVIDER_SITE_OTHER): Payer: BC Managed Care – PPO | Admitting: Family Medicine

## 2014-07-09 ENCOUNTER — Encounter: Payer: Self-pay | Admitting: Family Medicine

## 2014-07-09 VITALS — BP 118/93 | HR 105 | Ht 68.0 in | Wt 158.0 lb

## 2014-07-09 DIAGNOSIS — N920 Excessive and frequent menstruation with regular cycle: Secondary | ICD-10-CM

## 2014-07-09 DIAGNOSIS — R232 Flushing: Secondary | ICD-10-CM | POA: Insufficient documentation

## 2014-07-09 DIAGNOSIS — N951 Menopausal and female climacteric states: Secondary | ICD-10-CM

## 2014-07-09 DIAGNOSIS — G4700 Insomnia, unspecified: Secondary | ICD-10-CM

## 2014-07-09 MED ORDER — ZOLPIDEM TARTRATE ER 12.5 MG PO TBCR: 12.5000 mg | EXTENDED_RELEASE_TABLET | Freq: Every evening | ORAL | Status: DC | PRN

## 2014-07-09 NOTE — Progress Notes (Signed)
Patient is here for yearly exam.  She is having increased hot flashes.

## 2014-07-09 NOTE — Progress Notes (Signed)
    Subjective:    Patient ID: Rebecca Roman is a 48 y.o. female presenting with Follow-up  on 07/09/2014  HPI: Ambien is helping her go to sleep.  Still having some early am wakening. Reports new onset hot flashes which are also waking her up during the night. Cycles have improved now with Mirena and are regular but much lighter.  Review of Systems  Constitutional: Negative for fever and chills.  Respiratory: Negative for shortness of breath.   Cardiovascular: Negative for chest pain.  Neurological: Negative for light-headedness.      Objective:    BP 118/93  Pulse 105  Ht 5\' 8"  (1.727 m)  Wt 158 lb (71.668 kg)  BMI 24.03 kg/m2  LMP 05/11/2014 Physical Exam  Vitals reviewed. Constitutional: She appears well-developed and well-nourished. No distress.  HENT:  Head: Normocephalic.  Cardiovascular: Normal rate.   Pulmonary/Chest: Effort normal.  Abdominal: Soft.        Assessment & Plan:  Excessive or frequent menstruation Improved with Mirena  Insomnia Trial of Ambien CR--to see if this helps her sleep more through the night.  Hot flashes Discussed dressing in layers, having a fan, and medicinal therapies--will hold on these for now.      Return in about 3 months (around 10/09/2014).

## 2014-07-09 NOTE — Assessment & Plan Note (Signed)
Trial of Ambien CR--to see if this helps her sleep more through the night.

## 2014-07-09 NOTE — Assessment & Plan Note (Signed)
Discussed dressing in layers, having a fan, and medicinal therapies--will hold on these for now.

## 2014-07-09 NOTE — Assessment & Plan Note (Signed)
Improved with Mirena

## 2014-07-09 NOTE — Patient Instructions (Signed)
Menopause Menopause is the normal time of life when menstrual periods stop completely. Menopause is complete when you have missed 12 consecutive menstrual periods. It usually occurs between the ages of 48 years and 55 years. Very rarely does a woman develop menopause before the age of 40 years. At menopause, your ovaries stop producing the female hormones estrogen and progesterone. This can cause undesirable symptoms and also affect your health. Sometimes the symptoms may occur 4-5 years before the menopause begins. There is no relationship between menopause and:  Oral contraceptives.  Number of children you had.  Race.  The age your menstrual periods started (menarche). Heavy smokers and very thin women may develop menopause earlier in life. CAUSES  The ovaries stop producing the female hormones estrogen and progesterone.  Other causes include:  Surgery to remove both ovaries.  The ovaries stop functioning for no known reason.  Tumors of the pituitary gland in the brain.  Medical disease that affects the ovaries and hormone production.  Radiation treatment to the abdomen or pelvis.  Chemotherapy that affects the ovaries. SYMPTOMS   Hot flashes.  Night sweats.  Decrease in sex drive.  Vaginal dryness and thinning of the vagina causing painful intercourse.  Dryness of the skin and developing wrinkles.  Headaches.  Tiredness.  Irritability.  Memory problems.  Weight gain.  Bladder infections.  Hair growth of the face and chest.  Infertility. More serious symptoms include:  Loss of bone (osteoporosis) causing breaks (fractures).  Depression.  Hardening and narrowing of the arteries (atherosclerosis) causing heart attacks and strokes. DIAGNOSIS   When the menstrual periods have stopped for 12 straight months.  Physical exam.  Hormone studies of the blood. TREATMENT  There are many treatment choices and nearly as many questions about them. The  decisions to treat or not to treat menopausal changes is an individual choice made with your health care provider. Your health care provider can discuss the treatments with you. Together, you can decide which treatment will work best for you. Your treatment choices may include:   Hormone therapy (estrogen and progesterone).  Non-hormonal medicines.  Treating the individual symptoms with medicine (for example antidepressants for depression).  Herbal medicines that may help specific symptoms.  Counseling by a psychiatrist or psychologist.  Group therapy.  Lifestyle changes including:  Eating healthy.  Regular exercise.  Limiting caffeine and alcohol.  Stress management and meditation.  No treatment. HOME CARE INSTRUCTIONS   Take the medicine your health care provider gives you as directed.  Get plenty of sleep and rest.  Exercise regularly.  Eat a diet that contains calcium (good for the bones) and soy products (acts like estrogen hormone).  Avoid alcoholic beverages.  Do not smoke.  If you have hot flashes, dress in layers.  Take supplements, calcium, and vitamin D to strengthen bones.  You can use over-the-counter lubricants or moisturizers for vaginal dryness.  Group therapy is sometimes very helpful.  Acupuncture may be helpful in some cases. SEEK MEDICAL CARE IF:   You are not sure you are in menopause.  You are having menopausal symptoms and need advice and treatment.  You are still having menstrual periods after age 55 years.  You have pain with intercourse.  Menopause is complete (no menstrual period for 12 months) and you develop vaginal bleeding.  You need a referral to a specialist (gynecologist, psychiatrist, or psychologist) for treatment. SEEK IMMEDIATE MEDICAL CARE IF:   You have severe depression.  You have excessive vaginal bleeding.    You fell and think you have a broken bone.  You have pain when you urinate.  You develop leg or  chest pain.  You have a fast pounding heart beat (palpitations).  You have severe headaches.  You develop vision problems.  You feel a lump in your breast.  You have abdominal pain or severe indigestion. Document Released: 02/17/2004 Document Revised: 07/30/2013 Document Reviewed: 06/26/2013 [facility] Patient Information 2015 [place], [place]. This information is not intended to replace advice given to you by your health care provider. Make sure you discuss any questions you have with your health care provider. Insomnia Insomnia is frequent trouble falling and/or staying asleep. Insomnia can be a long term problem or a short term problem. Both are common. Insomnia can be a short term problem when the wakefulness is related to a certain stress or worry. Long term insomnia is often related to ongoing stress during waking hours and/or poor sleeping habits. Overtime, sleep deprivation itself can make the problem worse. Every little thing feels more severe because you are overtired and your ability to cope is decreased. CAUSES   Stress, anxiety, and depression.  Poor sleeping habits.  Distractions such as TV in the bedroom.  Naps close to bedtime.  Engaging in emotionally charged conversations before bed.  Technical reading before sleep.  Alcohol and other sedatives. They may make the problem worse. They can hurt normal sleep patterns and normal dream activity.  Stimulants such as caffeine for several hours prior to bedtime.  Pain syndromes and shortness of breath can cause insomnia.  Exercise late at night.  Changing time zones may cause sleeping problems (jet lag). It is sometimes helpful to have someone observe your sleeping patterns. They should look for periods of not breathing during the night (sleep apnea). They should also look to see how long those periods last. If you live alone or observers are uncertain, you can also be observed at a sleep clinic where your sleep patterns  will be professionally monitored. Sleep apnea requires a checkup and treatment. Give your caregivers your medical history. Give your caregivers observations your family has made about your sleep.  SYMPTOMS   Not feeling rested in the morning.  Anxiety and restlessness at bedtime.  Difficulty falling and staying asleep. TREATMENT   Your caregiver may prescribe treatment for an underlying medical disorders. Your caregiver can give advice or help if you are using alcohol or other drugs for self-medication. Treatment of underlying problems will usually eliminate insomnia problems.  Medications can be prescribed for short time use. They are generally not recommended for lengthy use.  Over-the-counter sleep medicines are not recommended for lengthy use. They can be habit forming.  You can promote easier sleeping by making lifestyle changes such as:  Using relaxation techniques that help with breathing and reduce muscle tension.  Exercising earlier in the day.  Changing your diet and the time of your last meal. No night time snacks.  Establish a regular time to go to bed.  Counseling can help with stressful problems and worry.  Soothing music and white noise may be helpful if there are background noises you cannot remove.  Stop tedious detailed work at least one hour before bedtime. HOME CARE INSTRUCTIONS   Keep a diary. Inform your caregiver about your progress. This includes any medication side effects. See your caregiver regularly. Take note of:  Times when you are asleep.  Times when you are awake during the night.  The quality of your sleep.  How  you feel the next day. This information will help your caregiver care for you.  Get out of bed if you are still awake after 15 minutes. Read or do some quiet activity. Keep the lights down. Wait until you feel sleepy and go back to bed.  Keep regular sleeping and waking hours. Avoid naps.  Exercise regularly.  Avoid  distractions at bedtime. Distractions include watching television or engaging in any intense or detailed activity like attempting to balance the household checkbook.  Develop a bedtime ritual. Keep a familiar routine of bathing, brushing your teeth, climbing into bed at the same time each night, listening to soothing music. Routines increase the success of falling to sleep faster.  Use relaxation techniques. This can be using breathing and muscle tension release routines. It can also include visualizing peaceful scenes. You can also help control troubling or intruding thoughts by keeping your mind occupied with boring or repetitive thoughts like the old concept of counting sheep. You can make it more creative like imagining planting one beautiful flower after another in your backyard garden.  During your day, work to eliminate stress. When this is not possible use some of the previous suggestions to help reduce the anxiety that accompanies stressful situations. MAKE SURE YOU:   Understand these instructions.  Will watch your condition.  Will get help right away if you are not doing well or get worse. Document Released: 11/24/2000 Document Revised: 02/19/2012 Document Reviewed: 12/25/2007 [facility] Patient Information 2015 [place], [place]. This information is not intended to replace advice given to you by your health care provider. Make sure you discuss any questions you have with your health care provider.

## 2014-08-05 ENCOUNTER — Telehealth: Payer: Self-pay | Admitting: *Deleted

## 2014-08-05 NOTE — Telephone Encounter (Signed)
Patient wanted to let us know that she likes the regular [place] and feels like the extended release does not work as well as the regular.

## 2014-08-13 ENCOUNTER — Other Ambulatory Visit: Payer: Self-pay | Admitting: *Deleted

## 2014-08-13 DIAGNOSIS — G4700 Insomnia, unspecified: Secondary | ICD-10-CM

## 2014-08-13 MED ORDER — ZOLPIDEM TARTRATE 10 MG PO TABS: 10.0000 mg | ORAL_TABLET | Freq: Every evening | ORAL | Status: DC | PRN

## 2014-08-13 NOTE — Telephone Encounter (Signed)
Patient prefers the regular Ambien over the CR.  She needs refills.

## 2014-08-14 ENCOUNTER — Other Ambulatory Visit: Payer: Self-pay | Admitting: *Deleted

## 2014-08-14 NOTE — Telephone Encounter (Signed)
Pharmacy called to confirm the correct rx for Ambien should be the 10mg  and not The CR 12.5mg .  Patient will pick up correct rx.

## 2014-09-29 ENCOUNTER — Telehealth: Payer: Self-pay | Admitting: *Deleted

## 2014-09-29 DIAGNOSIS — J302 Other seasonal allergic rhinitis: Secondary | ICD-10-CM

## 2014-09-29 MED ORDER — FEXOFENADINE-PSEUDOEPHED ER 180-240 MG PO TB24: 1.0000 | ORAL_TABLET | Freq: Every day | ORAL | Status: DC

## 2014-09-29 NOTE — Telephone Encounter (Signed)
Message copied by Erik Obey on Tue Sep 29, 2014 10:17 AM ------      Message from: Tarry Kos      Created: Mon Sep 28, 2014  2:05 PM       Rebecca Roman needs a refill on her Allegra D 24hour to [place]. ------

## 2014-09-29 NOTE — Telephone Encounter (Signed)
Rx refilled for one year

## 2014-10-12 ENCOUNTER — Encounter: Payer: Self-pay | Admitting: Family Medicine

## 2014-10-12 ENCOUNTER — Telehealth: Payer: Self-pay | Admitting: *Deleted

## 2014-10-12 MED ORDER — AZITHROMYCIN 250 MG PO TABS: 250.0000 mg | ORAL_TABLET | Freq: Every day | ORAL | Status: DC

## 2014-10-12 MED ORDER — FLUCONAZOLE 150 MG PO TABS: 150.0000 mg | ORAL_TABLET | Freq: Once | ORAL | Status: DC

## 2014-10-12 NOTE — Telephone Encounter (Signed)
Patient is having a sinus infection for over a week now and would like something called in.  She also needs a Diflucan for the yeast infection that will come with taking an antobiotic.

## 2014-11-24 ENCOUNTER — Encounter: Payer: Self-pay | Admitting: Family Medicine

## 2014-11-24 ENCOUNTER — Ambulatory Visit (INDEPENDENT_AMBULATORY_CARE_PROVIDER_SITE_OTHER): Payer: BC Managed Care – PPO | Admitting: Family Medicine

## 2014-11-24 VITALS — BP 117/87 | HR 97 | Ht 69.0 in | Wt 142.0 lb

## 2014-11-24 DIAGNOSIS — Z1151 Encounter for screening for human papillomavirus (HPV): Secondary | ICD-10-CM | POA: Diagnosis not present

## 2014-11-24 DIAGNOSIS — Z01419 Encounter for gynecological examination (general) (routine) without abnormal findings: Secondary | ICD-10-CM | POA: Diagnosis not present

## 2014-11-24 DIAGNOSIS — Z30431 Encounter for routine checking of intrauterine contraceptive device: Secondary | ICD-10-CM

## 2014-11-24 DIAGNOSIS — Z124 Encounter for screening for malignant neoplasm of cervix: Secondary | ICD-10-CM | POA: Diagnosis not present

## 2014-11-24 DIAGNOSIS — G4700 Insomnia, unspecified: Secondary | ICD-10-CM

## 2014-11-24 LAB — COMPREHENSIVE METABOLIC PANEL
ALT: 9 U/L (ref 0–35)
AST: 17 U/L (ref 0–37)
Albumin: 4.3 g/dL (ref 3.5–5.2)
Alkaline Phosphatase: 77 U/L (ref 39–117)
BILIRUBIN TOTAL: 0.8 mg/dL (ref 0.2–1.2)
BUN: 16 mg/dL (ref 6–23)
CO2: 26 mEq/L (ref 19–32)
CREATININE: 0.82 mg/dL (ref 0.50–1.10)
Calcium: 9.6 mg/dL (ref 8.4–10.5)
Chloride: 104 mEq/L (ref 96–112)
GLUCOSE: 83 mg/dL (ref 70–99)
Potassium: 4.8 mEq/L (ref 3.5–5.3)
Sodium: 140 mEq/L (ref 135–145)
Total Protein: 7.4 g/dL (ref 6.0–8.3)

## 2014-11-24 LAB — LIPID PANEL
CHOLESTEROL: 181 mg/dL (ref 0–200)
HDL: 61 mg/dL (ref >39)
LDL Cholesterol: 105 mg/dL — ABNORMAL HIGH (ref 0–99)
TRIGLYCERIDES: 75 mg/dL (ref <150)
Total CHOL/HDL Ratio: 3 Ratio
VLDL: 15 mg/dL (ref 0–40)

## 2014-11-24 NOTE — Progress Notes (Signed)
  Subjective:     Rebecca Roman is a 48 y.o. female and is here for a comprehensive physical exam. The patient reports no problems. Notes some irregular cycles.  History   Social History  . Marital Status: Single    Spouse Name: N/A    Number of Children: N/A  . Years of Education: N/A   Occupational History  . Not on file.   Social History Main Topics  . Smoking status: Never Smoker   . Smokeless tobacco: Never Used  . Alcohol Use: No  . Drug Use: No  . Sexual Activity:    Partners: Male   Other Topics Concern  . Not on file   Social History Narrative   Health Maintenance  Topic Date Due  . INFLUENZA VACCINE  07/11/2014  . PAP SMEAR  11/18/2016  . TETANUS/TDAP  07/28/2021    The following portions of the patient's history were reviewed and updated as appropriate: allergies, current medications, past family history, past medical history, past social history, past surgical history and problem list.  Review of Systems A comprehensive review of systems was negative.   Objective:    BP 117/87 mmHg  Pulse 97  Ht 5\' 9"  (1.753 m)  Wt 142 lb (64.411 kg)  BMI 20.96 kg/m2  LMP 11/03/2014 General appearance: alert, cooperative and appears stated age Head: Normocephalic, without obvious abnormality, atraumatic Neck: no adenopathy, supple, symmetrical, trachea midline and thyroid not enlarged, symmetric, no tenderness/mass/nodules Lungs: clear to auscultation bilaterally Breasts: normal appearance, no masses or tenderness Heart: regular rate and rhythm, S1, S2 normal, no murmur, click, rub or gallop Abdomen: soft, non-tender; bowel sounds normal; no masses,  no organomegaly Pelvic: cervix normal in appearance, external genitalia normal, no adnexal masses or tenderness, no cervical motion tenderness, uterus normal size, shape, and consistency, vagina normal without discharge and IUD strings are not seen Extremities: extremities normal, atraumatic, no cyanosis or  edema Pulses: 2+ and symmetric Skin: Skin color, texture, turgor normal. No rashes or lesions Lymph nodes: Cervical, supraclavicular, and axillary nodes normal. Neurologic: Grossly normal    TVUS reveals IUD to be in place. Assessment:    Healthy female exam.      Plan:      Problem List Items Addressed This Visit      Unprioritized   Insomnia - Primary    Other Visit Diagnoses    Screening for malignant neoplasm of cervix        Relevant Orders       Cytology - PAP    Encounter for routine gynecological examination        Relevant Orders       Cytology - PAP       CBC       Comprehensive metabolic panel       TSH       Lipid panel       MM DIGITAL SCREENING BILATERAL       See After Visit Summary for Counseling Recommendations

## 2014-11-24 NOTE — Patient Instructions (Signed)
Preventive Care for Adults A healthy lifestyle and preventive care can promote health and wellness. Preventive health guidelines for women include the following key practices.  A routine yearly physical is a good way to check with your health care provider about your health and preventive screening. It is a chance to share any concerns and updates on your health and to receive a thorough exam.  Visit your dentist for a routine exam and preventive care every 6 months. Brush your teeth twice a day and floss once a day. Good oral hygiene prevents tooth decay and gum disease.  The frequency of eye exams is based on your age, health, family medical history, use of contact lenses, and other factors. Follow your health care provider's recommendations for frequency of eye exams.  Eat a healthy diet. Foods like vegetables, fruits, whole grains, low-fat dairy products, and lean protein foods contain the nutrients you need without too many calories. Decrease your intake of foods high in solid fats, added sugars, and salt. Eat the right amount of calories for you.Get information about a proper diet from your health care provider, if necessary.  Regular physical exercise is one of the most important things you can do for your health. Most adults should get at least 150 minutes of moderate-intensity exercise (any activity that increases your heart rate and causes you to sweat) each week. In addition, most adults need muscle-strengthening exercises on 2 or more days a week.  Maintain a healthy weight. The body mass index (BMI) is a screening tool to identify possible weight problems. It provides an estimate of body fat based on height and weight. Your health care provider can find your BMI and can help you achieve or maintain a healthy weight.For adults 20 years and older:  A BMI below 18.5 is considered underweight.  A BMI of 18.5 to 24.9 is normal.  A BMI of 25 to 29.9 is considered overweight.  A BMI of  30 and above is considered obese.  Maintain normal blood lipids and cholesterol levels by exercising and minimizing your intake of saturated fat. Eat a balanced diet with plenty of fruit and vegetables. Blood tests for lipids and cholesterol should begin at age 76 and be repeated every 5 years. If your lipid or cholesterol levels are high, you are over 50, or you are at high risk for heart disease, you may need your cholesterol levels checked more frequently.Ongoing high lipid and cholesterol levels should be treated with medicines if diet and exercise are not working.  If you smoke, find out from your health care provider how to quit. If you do not use tobacco, do not start.  Lung cancer screening is recommended for adults aged 22-80 years who are at high risk for developing lung cancer because of a history of smoking. A yearly low-dose CT scan of the lungs is recommended for people who have at least a 30-pack-year history of smoking and are a current smoker or have quit within the past 15 years. A pack year of smoking is smoking an average of 1 pack of cigarettes a day for 1 year (for example: 1 pack a day for 30 years or 2 packs a day for 15 years). Yearly screening should continue until the smoker has stopped smoking for at least 15 years. Yearly screening should be stopped for people who develop a health problem that would prevent them from having lung cancer treatment.  If you are pregnant, do not drink alcohol. If you are breastfeeding,  be very cautious about drinking alcohol. If you are not pregnant and choose to drink alcohol, do not have more than 1 drink per day. One drink is considered to be 12 ounces (355 mL) of beer, 5 ounces (148 mL) of wine, or 1.5 ounces (44 mL) of liquor.  Avoid use of street drugs. Do not share needles with anyone. Ask for help if you need support or instructions about stopping the use of drugs.  High blood pressure causes heart disease and increases the risk of  stroke. Your blood pressure should be checked at least every 1 to 2 years. Ongoing high blood pressure should be treated with medicines if weight loss and exercise do not work.  If you are 3-86 years old, ask your health care provider if you should take aspirin to prevent strokes.  Diabetes screening involves taking a blood sample to check your fasting blood sugar level. This should be done once every 3 years, after age 67, if you are within normal weight and without risk factors for diabetes. Testing should be considered at a younger age or be carried out more frequently if you are overweight and have at least 1 risk factor for diabetes.  Breast cancer screening is essential preventive care for women. You should practice "breast self-awareness." This means understanding the normal appearance and feel of your breasts and may include breast self-examination. Any changes detected, no matter how small, should be reported to a health care provider. Women in their 8s and 30s should have a clinical breast exam (CBE) by a health care provider as part of a regular health exam every 1 to 3 years. After age 70, women should have a CBE every year. Starting at age 25, women should consider having a mammogram (breast X-ray test) every year. Women who have a family history of breast cancer should talk to their health care provider about genetic screening. Women at a high risk of breast cancer should talk to their health care providers about having an MRI and a mammogram every year.  Breast cancer gene (BRCA)-related cancer risk assessment is recommended for women who have family members with BRCA-related cancers. BRCA-related cancers include breast, ovarian, tubal, and peritoneal cancers. Having family members with these cancers may be associated with an increased risk for harmful changes (mutations) in the breast cancer genes BRCA1 and BRCA2. Results of the assessment will determine the need for genetic counseling and  BRCA1 and BRCA2 testing.  Routine pelvic exams to screen for cancer are no longer recommended for nonpregnant women who are considered low risk for cancer of the pelvic organs (ovaries, uterus, and vagina) and who do not have symptoms. Ask your health care provider if a screening pelvic exam is right for you.  If you have had past treatment for cervical cancer or a condition that could lead to cancer, you need Pap tests and screening for cancer for at least 20 years after your treatment. If Pap tests have been discontinued, your risk factors (such as having a new sexual partner) need to be reassessed to determine if screening should be resumed. Some women have medical problems that increase the chance of getting cervical cancer. In these cases, your health care provider may recommend more frequent screening and Pap tests.  The HPV test is an additional test that may be used for cervical cancer screening. The HPV test looks for the virus that can cause the cell changes on the cervix. The cells collected during the Pap test can be  tested for HPV. The HPV test could be used to screen women aged 30 years and older, and should be used in women of any age who have unclear Pap test results. After the age of 30, women should have HPV testing at the same frequency as a Pap test.  Colorectal cancer can be detected and often prevented. Most routine colorectal cancer screening begins at the age of 50 years and continues through age 75 years. However, your health care provider may recommend screening at an earlier age if you have risk factors for colon cancer. On a yearly basis, your health care provider may provide home test kits to check for hidden blood in the stool. Use of a small camera at the end of a tube, to directly examine the colon (sigmoidoscopy or colonoscopy), can detect the earliest forms of colorectal cancer. Talk to your health care provider about this at age 50, when routine screening begins. Direct  exam of the colon should be repeated every 5-10 years through age 75 years, unless early forms of pre-cancerous polyps or small growths are found.  People who are at an increased risk for hepatitis B should be screened for this virus. You are considered at high risk for hepatitis B if:  You were born in a country where hepatitis B occurs often. Talk with your health care provider about which countries are considered high risk.  Your parents were born in a high-risk country and you have not received a shot to protect against hepatitis B (hepatitis B vaccine).  You have HIV or AIDS.  You use needles to inject street drugs.  You live with, or have sex with, someone who has hepatitis B.  You get hemodialysis treatment.  You take certain medicines for conditions like cancer, organ transplantation, and autoimmune conditions.  Hepatitis C blood testing is recommended for all people born from 1945 through 1965 and any individual with known risks for hepatitis C.  Practice safe sex. Use condoms and avoid high-risk sexual practices to reduce the spread of sexually transmitted infections (STIs). STIs include gonorrhea, chlamydia, syphilis, trichomonas, herpes, HPV, and human immunodeficiency virus (HIV). Herpes, HIV, and HPV are viral illnesses that have no cure. They can result in disability, cancer, and death.  You should be screened for sexually transmitted illnesses (STIs) including gonorrhea and chlamydia if:  You are sexually active and are younger than 24 years.  You are older than 24 years and your health care provider tells you that you are at risk for this type of infection.  Your sexual activity has changed since you were last screened and you are at an increased risk for chlamydia or gonorrhea. Ask your health care provider if you are at risk.  If you are at risk of being infected with HIV, it is recommended that you take a prescription medicine daily to prevent HIV infection. This is  called preexposure prophylaxis (PrEP). You are considered at risk if:  You are a heterosexual woman, are sexually active, and are at increased risk for HIV infection.  You take drugs by injection.  You are sexually active with a partner who has HIV.  Talk with your health care provider about whether you are at high risk of being infected with HIV. If you choose to begin PrEP, you should first be tested for HIV. You should then be tested every 3 months for as long as you are taking PrEP.  Osteoporosis is a disease in which the bones lose minerals and strength   with aging. This can result in serious bone fractures or breaks. The risk of osteoporosis can be identified using a bone density scan. Women ages 65 years and over and women at risk for fractures or osteoporosis should discuss screening with their health care providers. Ask your health care provider whether you should take a calcium supplement or vitamin D to reduce the rate of osteoporosis.  Menopause can be associated with physical symptoms and risks. Hormone replacement therapy is available to decrease symptoms and risks. You should talk to your health care provider about whether hormone replacement therapy is right for you.  Use sunscreen. Apply sunscreen liberally and repeatedly throughout the day. You should seek shade when your shadow is shorter than you. Protect yourself by wearing long sleeves, pants, a wide-brimmed hat, and sunglasses year round, whenever you are outdoors.  Once a month, do a whole body skin exam, using a mirror to look at the skin on your back. Tell your health care provider of new moles, moles that have irregular borders, moles that are larger than a pencil eraser, or moles that have changed in shape or color.  Stay current with required vaccines (immunizations).  Influenza vaccine. All adults should be immunized every year.  Tetanus, diphtheria, and acellular pertussis (Td, Tdap) vaccine. Pregnant women should  receive 1 dose of Tdap vaccine during each pregnancy. The dose should be obtained regardless of the length of time since the last dose. Immunization is preferred during the 27th-36th week of gestation. An adult who has not previously received Tdap or who does not know her vaccine status should receive 1 dose of Tdap. This initial dose should be followed by tetanus and diphtheria toxoids (Td) booster doses every 10 years. Adults with an unknown or incomplete history of completing a 3-dose immunization series with Td-containing vaccines should begin or complete a primary immunization series including a Tdap dose. Adults should receive a Td booster every 10 years.  Varicella vaccine. An adult without evidence of immunity to varicella should receive 2 doses or a second dose if she has previously received 1 dose. Pregnant females who do not have evidence of immunity should receive the first dose after pregnancy. This first dose should be obtained before leaving the health care facility. The second dose should be obtained 4-8 weeks after the first dose.  Human papillomavirus (HPV) vaccine. Females aged 13-26 years who have not received the vaccine previously should obtain the 3-dose series. The vaccine is not recommended for use in pregnant females. However, pregnancy testing is not needed before receiving a dose. If a female is found to be pregnant after receiving a dose, no treatment is needed. In that case, the remaining doses should be delayed until after the pregnancy. Immunization is recommended for any person with an immunocompromised condition through the age of 26 years if she did not get any or all doses earlier. During the 3-dose series, the second dose should be obtained 4-8 weeks after the first dose. The third dose should be obtained 24 weeks after the first dose and 16 weeks after the second dose.  Zoster vaccine. One dose is recommended for adults aged 60 years or older unless certain conditions are  present.  Measles, mumps, and rubella (MMR) vaccine. Adults born before 1957 generally are considered immune to measles and mumps. Adults born in 1957 or later should have 1 or more doses of MMR vaccine unless there is a contraindication to the vaccine or there is laboratory evidence of immunity to   each of the three diseases. A routine second dose of MMR vaccine should be obtained at least 28 days after the first dose for students attending postsecondary schools, health care workers, or international travelers. People who received inactivated measles vaccine or an unknown type of measles vaccine during 1963-1967 should receive 2 doses of MMR vaccine. People who received inactivated mumps vaccine or an unknown type of mumps vaccine before 1979 and are at high risk for mumps infection should consider immunization with 2 doses of MMR vaccine. For females of childbearing age, rubella immunity should be determined. If there is no evidence of immunity, females who are not pregnant should be vaccinated. If there is no evidence of immunity, females who are pregnant should delay immunization until after pregnancy. Unvaccinated health care workers born before 1957 who lack laboratory evidence of measles, mumps, or rubella immunity or laboratory confirmation of disease should consider measles and mumps immunization with 2 doses of MMR vaccine or rubella immunization with 1 dose of MMR vaccine.  Pneumococcal 13-valent conjugate (PCV13) vaccine. When indicated, a person who is uncertain of her immunization history and has no record of immunization should receive the PCV13 vaccine. An adult aged 19 years or older who has certain medical conditions and has not been previously immunized should receive 1 dose of PCV13 vaccine. This PCV13 should be followed with a dose of pneumococcal polysaccharide (PPSV23) vaccine. The PPSV23 vaccine dose should be obtained at least 8 weeks after the dose of PCV13 vaccine. An adult aged 19  years or older who has certain medical conditions and previously received 1 or more doses of PPSV23 vaccine should receive 1 dose of PCV13. The PCV13 vaccine dose should be obtained 1 or more years after the last PPSV23 vaccine dose.  Pneumococcal polysaccharide (PPSV23) vaccine. When PCV13 is also indicated, PCV13 should be obtained first. All adults aged 65 years and older should be immunized. An adult younger than age 65 years who has certain medical conditions should be immunized. Any person who resides in a nursing home or long-term care facility should be immunized. An adult smoker should be immunized. People with an immunocompromised condition and certain other conditions should receive both PCV13 and PPSV23 vaccines. People with human immunodeficiency virus (HIV) infection should be immunized as soon as possible after diagnosis. Immunization during chemotherapy or radiation therapy should be avoided. Routine use of PPSV23 vaccine is not recommended for American Indians, [place] Natives, or people younger than 65 years unless there are medical conditions that require PPSV23 vaccine. When indicated, people who have unknown immunization and have no record of immunization should receive PPSV23 vaccine. One-time revaccination 5 years after the first dose of PPSV23 is recommended for people aged 19-64 years who have chronic kidney failure, nephrotic syndrome, asplenia, or immunocompromised conditions. People who received 1-2 doses of PPSV23 before age 65 years should receive another dose of PPSV23 vaccine at age 65 years or later if at least 5 years have passed since the previous dose. Doses of PPSV23 are not needed for people immunized with PPSV23 at or after age 65 years.  Meningococcal vaccine. Adults with asplenia or persistent complement component deficiencies should receive 2 doses of quadrivalent meningococcal conjugate (MenACWY-D) vaccine. The doses should be obtained at least 2 months apart.  Microbiologists working with certain meningococcal bacteria, military recruits, people at risk during an outbreak, and people who travel to or live in countries with a high rate of meningitis should be immunized. A first-year college student up through age   21 years who is living in a residence hall should receive a dose if she did not receive a dose on or after her 16th birthday. Adults who have certain high-risk conditions should receive one or more doses of vaccine.  Hepatitis A vaccine. Adults who wish to be protected from this disease, have certain high-risk conditions, work with hepatitis A-infected animals, work in hepatitis A research labs, or travel to or work in countries with a high rate of hepatitis A should be immunized. Adults who were previously unvaccinated and who anticipate close contact with an international adoptee during the first 60 days after arrival in the [place] from a country with a high rate of hepatitis A should be immunized.  Hepatitis B vaccine. Adults who wish to be protected from this disease, have certain high-risk conditions, may be exposed to blood or other infectious body fluids, are household contacts or sex partners of hepatitis B positive people, are clients or workers in certain care facilities, or travel to or work in countries with a high rate of hepatitis B should be immunized.  Haemophilus influenzae type b (Hib) vaccine. A previously unvaccinated person with asplenia or sickle cell disease or having a scheduled splenectomy should receive 1 dose of Hib vaccine. Regardless of previous immunization, a recipient of a hematopoietic stem cell transplant should receive a 3-dose series 6-12 months after her successful transplant. Hib vaccine is not recommended for adults with HIV infection. Preventive Services / Frequency Ages 64 to 68 years  Blood pressure check.** / Every 1 to 2 years.  Lipid and cholesterol check.** / Every 5 years beginning at age  22.  Clinical breast exam.** / Every 3 years for women in their 88s and 53s.  BRCA-related cancer risk assessment.** / For women who have family members with a BRCA-related cancer (breast, ovarian, tubal, or peritoneal cancers).  Pap test.** / Every 2 years from ages 90 through 51. Every 3 years starting at age 21 through age 56 or 3 with a history of 3 consecutive normal Pap tests.  HPV screening.** / Every 3 years from ages 24 through ages 1 to 46 with a history of 3 consecutive normal Pap tests.  Hepatitis C blood test.** / For any individual with known risks for hepatitis C.  Skin self-exam. / Monthly.  Influenza vaccine. / Every year.  Tetanus, diphtheria, and acellular pertussis (Tdap, Td) vaccine.** / Consult your health care provider. Pregnant women should receive 1 dose of Tdap vaccine during each pregnancy. 1 dose of Td every 10 years.  Varicella vaccine.** / Consult your health care provider. Pregnant females who do not have evidence of immunity should receive the first dose after pregnancy.  HPV vaccine. / 3 doses over 6 months, if 72 and younger. The vaccine is not recommended for use in pregnant females. However, pregnancy testing is not needed before receiving a dose.  Measles, mumps, rubella (MMR) vaccine.** / You need at least 1 dose of MMR if you were born in 1957 or later. You may also need a 2nd dose. For females of childbearing age, rubella immunity should be determined. If there is no evidence of immunity, females who are not pregnant should be vaccinated. If there is no evidence of immunity, females who are pregnant should delay immunization until after pregnancy.  Pneumococcal 13-valent conjugate (PCV13) vaccine.** / Consult your health care provider.  Pneumococcal polysaccharide (PPSV23) vaccine.** / 1 to 2 doses if you smoke cigarettes or if you have certain conditions.  Meningococcal vaccine.** /  1 dose if you are age 19 to 21 years and a first-year college  student living in a residence hall, or have one of several medical conditions, you need to get vaccinated against meningococcal disease. You may also need additional booster doses.  Hepatitis A vaccine.** / Consult your health care provider.  Hepatitis B vaccine.** / Consult your health care provider.  Haemophilus influenzae type b (Hib) vaccine.** / Consult your health care provider. Ages 40 to 64 years  Blood pressure check.** / Every 1 to 2 years.  Lipid and cholesterol check.** / Every 5 years beginning at age 20 years.  Lung cancer screening. / Every year if you are aged 55-80 years and have a 30-pack-year history of smoking and currently smoke or have quit within the past 15 years. Yearly screening is stopped once you have quit smoking for at least 15 years or develop a health problem that would prevent you from having lung cancer treatment.  Clinical breast exam.** / Every year after age 40 years.  BRCA-related cancer risk assessment.** / For women who have family members with a BRCA-related cancer (breast, ovarian, tubal, or peritoneal cancers).  Mammogram.** / Every year beginning at age 40 years and continuing for as long as you are in good health. Consult with your health care provider.  Pap test.** / Every 3 years starting at age 30 years through age 65 or 70 years with a history of 3 consecutive normal Pap tests.  HPV screening.** / Every 3 years from ages 30 years through ages 65 to 70 years with a history of 3 consecutive normal Pap tests.  Fecal occult blood test (FOBT) of stool. / Every year beginning at age 50 years and continuing until age 75 years. You may not need to do this test if you get a colonoscopy every 10 years.  Flexible sigmoidoscopy or colonoscopy.** / Every 5 years for a flexible sigmoidoscopy or every 10 years for a colonoscopy beginning at age 50 years and continuing until age 75 years.  Hepatitis C blood test.** / For all people born from 1945 through  1965 and any individual with known risks for hepatitis C.  Skin self-exam. / Monthly.  Influenza vaccine. / Every year.  Tetanus, diphtheria, and acellular pertussis (Tdap/Td) vaccine.** / Consult your health care provider. Pregnant women should receive 1 dose of Tdap vaccine during each pregnancy. 1 dose of Td every 10 years.  Varicella vaccine.** / Consult your health care provider. Pregnant females who do not have evidence of immunity should receive the first dose after pregnancy.  Zoster vaccine.** / 1 dose for adults aged 60 years or older.  Measles, mumps, rubella (MMR) vaccine.** / You need at least 1 dose of MMR if you were born in 1957 or later. You may also need a 2nd dose. For females of childbearing age, rubella immunity should be determined. If there is no evidence of immunity, females who are not pregnant should be vaccinated. If there is no evidence of immunity, females who are pregnant should delay immunization until after pregnancy.  Pneumococcal 13-valent conjugate (PCV13) vaccine.** / Consult your health care provider.  Pneumococcal polysaccharide (PPSV23) vaccine.** / 1 to 2 doses if you smoke cigarettes or if you have certain conditions.  Meningococcal vaccine.** / Consult your health care provider.  Hepatitis A vaccine.** / Consult your health care provider.  Hepatitis B vaccine.** / Consult your health care provider.  Haemophilus influenzae type b (Hib) vaccine.** / Consult your health care provider. Ages 65   years and over  Blood pressure check.** / Every 1 to 2 years.  Lipid and cholesterol check.** / Every 5 years beginning at age 22 years.  Lung cancer screening. / Every year if you are aged 73-80 years and have a 30-pack-year history of smoking and currently smoke or have quit within the past 15 years. Yearly screening is stopped once you have quit smoking for at least 15 years or develop a health problem that would prevent you from having lung cancer  treatment.  Clinical breast exam.** / Every year after age 4 years.  BRCA-related cancer risk assessment.** / For women who have family members with a BRCA-related cancer (breast, ovarian, tubal, or peritoneal cancers).  Mammogram.** / Every year beginning at age 40 years and continuing for as long as you are in good health. Consult with your health care provider.  Pap test.** / Every 3 years starting at age 9 years through age 34 or 91 years with 3 consecutive normal Pap tests. Testing can be stopped between 65 and 70 years with 3 consecutive normal Pap tests and no abnormal Pap or HPV tests in the past 10 years.  HPV screening.** / Every 3 years from ages 57 years through ages 64 or 45 years with a history of 3 consecutive normal Pap tests. Testing can be stopped between 65 and 70 years with 3 consecutive normal Pap tests and no abnormal Pap or HPV tests in the past 10 years.  Fecal occult blood test (FOBT) of stool. / Every year beginning at age 15 years and continuing until age 17 years. You may not need to do this test if you get a colonoscopy every 10 years.  Flexible sigmoidoscopy or colonoscopy.** / Every 5 years for a flexible sigmoidoscopy or every 10 years for a colonoscopy beginning at age 86 years and continuing until age 71 years.  Hepatitis C blood test.** / For all people born from [birth year] through 1964 and any individual with known risks for hepatitis C.  Osteoporosis screening.** / A one-time screening for women ages 83 years and over and women at risk for fractures or osteoporosis.  Skin self-exam. / Monthly.  Influenza vaccine. / Every year.  Tetanus, diphtheria, and acellular pertussis (Tdap/Td) vaccine.** / 1 dose of Td every 10 years.  Varicella vaccine.** / Consult your health care provider.  Zoster vaccine.** / 1 dose for adults aged 61 years or older.  Pneumococcal 13-valent conjugate (PCV13) vaccine.** / Consult your health care provider.  Pneumococcal  polysaccharide (PPSV23) vaccine.** / 1 dose for all adults aged 28 years and older.  Meningococcal vaccine.** / Consult your health care provider.  Hepatitis A vaccine.** / Consult your health care provider.  Hepatitis B vaccine.** / Consult your health care provider.  Haemophilus influenzae type b (Hib) vaccine.** / Consult your health care provider. ** Family history and personal history of risk and conditions may change your health care provider's recommendations. Document Released: 01/23/2002 Document Revised: 04/13/2014 Document Reviewed: 04/24/2011 [facility] Patient Information 2015 [place], [place]. This information is not intended to replace advice given to you by your health care provider. Make sure you discuss any questions you have with your health care provider.

## 2014-11-25 LAB — TSH: TSH: 3.386 u[IU]/mL (ref 0.350–4.500)

## 2014-11-25 LAB — CBC
HEMATOCRIT: 42.1 % (ref 36.0–46.0)
Hemoglobin: 13.5 g/dL (ref 12.0–15.0)
MCH: 27.1 pg (ref 26.0–34.0)
MCHC: 32.1 g/dL (ref 30.0–36.0)
MCV: 84.4 fL (ref 78.0–100.0)
MPV: 10.6 fL (ref 9.4–12.4)
PLATELETS: 363 10*3/uL (ref 150–400)
RBC: 4.99 MIL/uL (ref 3.87–5.11)
RDW: 14.3 % (ref 11.5–15.5)
WBC: 5.5 10*3/uL (ref 4.0–10.5)

## 2014-11-26 LAB — CYTOLOGY - PAP

## 2014-12-08 ENCOUNTER — Telehealth: Payer: Self-pay | Admitting: *Deleted

## 2014-12-08 ENCOUNTER — Telehealth: Payer: Self-pay | Admitting: Family Medicine

## 2014-12-08 DIAGNOSIS — J302 Other seasonal allergic rhinitis: Secondary | ICD-10-CM

## 2014-12-08 MED ORDER — FLUTICASONE PROPIONATE 50 MCG/ACT NA SUSP: 2.0000 | Freq: Every day | NASAL | Status: DC

## 2014-12-08 NOTE — Telephone Encounter (Signed)
Per Dr. Kennon Rounds call in [facility] for patient.  I sent it in.

## 2014-12-09 ENCOUNTER — Other Ambulatory Visit: Payer: Self-pay | Admitting: Family Medicine

## 2015-02-09 ENCOUNTER — Encounter: Payer: Self-pay | Admitting: *Deleted

## 2015-02-09 ENCOUNTER — Telehealth: Payer: Self-pay | Admitting: *Deleted

## 2015-02-09 DIAGNOSIS — J0110 Acute frontal sinusitis, unspecified: Secondary | ICD-10-CM

## 2015-02-09 MED ORDER — AZITHROMYCIN 250 MG PO TABS: 250.0000 mg | ORAL_TABLET | Freq: Every day | ORAL | Status: DC

## 2015-02-09 MED ORDER — FLUCONAZOLE 150 MG PO TABS: 150.0000 mg | ORAL_TABLET | Freq: Once | ORAL | Status: DC

## 2015-02-09 NOTE — Telephone Encounter (Signed)
Patient is having a sinus infection and would like an antibiotic called in for her.  She will also need medication for a yeast infection.

## 2015-03-08 ENCOUNTER — Telehealth: Payer: Self-pay | Admitting: *Deleted

## 2015-03-08 DIAGNOSIS — J0110 Acute frontal sinusitis, unspecified: Secondary | ICD-10-CM

## 2015-03-08 MED ORDER — AZITHROMYCIN 250 MG PO TABS: 250.0000 mg | ORAL_TABLET | Freq: Every day | ORAL | Status: DC

## 2015-03-08 NOTE — Telephone Encounter (Signed)
Patient is having a sinus infection and is requesting medication.

## 2015-04-05 ENCOUNTER — Telehealth: Payer: Self-pay | Admitting: *Deleted

## 2015-04-05 ENCOUNTER — Other Ambulatory Visit: Payer: Self-pay | Admitting: Family Medicine

## 2015-04-05 NOTE — Telephone Encounter (Signed)
I have called in a refill of Ambien 10 mg with 1 refill to patients pharmacy per Dr. Kennon Rounds.

## 2015-04-06 ENCOUNTER — Telehealth: Payer: Self-pay | Admitting: *Deleted

## 2015-04-06 DIAGNOSIS — J302 Other seasonal allergic rhinitis: Secondary | ICD-10-CM

## 2015-04-06 MED ORDER — MONTELUKAST SODIUM 10 MG PO TABS
10.0000 mg | ORAL_TABLET | Freq: Every day | ORAL | Status: DC
Start: 2015-04-06 — End: 2015-11-26

## 2015-04-06 NOTE — Telephone Encounter (Signed)
Pt needed a 90 day prescription called in to [facility] mail order.  I have sent in.

## 2015-05-26 ENCOUNTER — Encounter: Payer: Self-pay | Admitting: *Deleted

## 2015-05-26 ENCOUNTER — Telehealth: Payer: Self-pay | Admitting: *Deleted

## 2015-05-26 MED ORDER — ZOLPIDEM TARTRATE 10 MG PO TABS: 10.0000 mg | ORAL_TABLET | Freq: Every evening | ORAL | Status: DC | PRN

## 2015-05-26 NOTE — Telephone Encounter (Signed)
Patient called for refill of Ambien.

## 2015-09-30 ENCOUNTER — Telehealth: Payer: Self-pay | Admitting: *Deleted

## 2015-09-30 MED ORDER — ZOLPIDEM TARTRATE 10 MG PO TABS: 10.0000 mg | ORAL_TABLET | Freq: Every evening | ORAL | Status: DC | PRN

## 2015-09-30 NOTE — Telephone Encounter (Signed)
Spoke to Dr Kennon Rounds, okay to refill rx with 2 refills.  Pt will need to make appt for yearly follow-up to receive any additional refills.

## 2015-09-30 NOTE — Telephone Encounter (Signed)
-----   Message from Rebecca Roman sent at 09/30/2015  8:10 AM EDT ----- Regarding: Rx Refill Contact: [phone] Patient wants a refill on her Ambien Uses [facility]

## 2015-11-26 ENCOUNTER — Encounter: Payer: Self-pay | Admitting: Family Medicine

## 2015-11-26 ENCOUNTER — Ambulatory Visit (INDEPENDENT_AMBULATORY_CARE_PROVIDER_SITE_OTHER): Payer: BLUE CROSS/BLUE SHIELD | Admitting: Family Medicine

## 2015-11-26 ENCOUNTER — Other Ambulatory Visit: Payer: Self-pay | Admitting: Family Medicine

## 2015-11-26 VITALS — BP 140/90 | HR 92 | Resp 18 | Ht 68.0 in | Wt 150.0 lb

## 2015-11-26 DIAGNOSIS — Z30431 Encounter for routine checking of intrauterine contraceptive device: Secondary | ICD-10-CM

## 2015-11-26 DIAGNOSIS — J302 Other seasonal allergic rhinitis: Secondary | ICD-10-CM | POA: Diagnosis not present

## 2015-11-26 DIAGNOSIS — Z1151 Encounter for screening for human papillomavirus (HPV): Secondary | ICD-10-CM | POA: Diagnosis not present

## 2015-11-26 DIAGNOSIS — Z01419 Encounter for gynecological examination (general) (routine) without abnormal findings: Secondary | ICD-10-CM

## 2015-11-26 DIAGNOSIS — Z124 Encounter for screening for malignant neoplasm of cervix: Secondary | ICD-10-CM

## 2015-11-26 LAB — LIPID PANEL
CHOL/HDL RATIO: 2.7 ratio (ref <=5.0)
Cholesterol: 168 mg/dL (ref 125–200)
HDL: 62 mg/dL (ref >=46)
LDL CALC: 89 mg/dL (ref <130)
TRIGLYCERIDES: 84 mg/dL (ref <150)
VLDL: 17 mg/dL (ref <30)

## 2015-11-26 LAB — COMPREHENSIVE METABOLIC PANEL
ALK PHOS: 100 U/L (ref 33–115)
ALT: 9 U/L (ref 6–29)
AST: 16 U/L (ref 10–35)
Albumin: 4.2 g/dL (ref 3.6–5.1)
BUN: 16 mg/dL (ref 7–25)
CALCIUM: 9.1 mg/dL (ref 8.6–10.2)
CHLORIDE: 102 mmol/L (ref 98–110)
CO2: 29 mmol/L (ref 20–31)
Creat: 0.9 mg/dL (ref 0.50–1.10)
GLUCOSE: 91 mg/dL (ref 65–99)
POTASSIUM: 3.9 mmol/L (ref 3.5–5.3)
Sodium: 141 mmol/L (ref 135–146)
Total Bilirubin: 0.6 mg/dL (ref 0.2–1.2)
Total Protein: 7.6 g/dL (ref 6.1–8.1)

## 2015-11-26 LAB — CBC
HEMATOCRIT: 39.8 % (ref 36.0–46.0)
Hemoglobin: 12.7 g/dL (ref 12.0–15.0)
MCH: 25.6 pg — ABNORMAL LOW (ref 26.0–34.0)
MCHC: 31.9 g/dL (ref 30.0–36.0)
MCV: 80.1 fL (ref 78.0–100.0)
MPV: 10 fL (ref 8.6–12.4)
PLATELETS: 460 10*3/uL — AB (ref 150–400)
RBC: 4.97 MIL/uL (ref 3.87–5.11)
RDW: 15.1 % (ref 11.5–15.5)
WBC: 10.9 10*3/uL — ABNORMAL HIGH (ref 4.0–10.5)

## 2015-11-26 MED ORDER — ZOLPIDEM TARTRATE 10 MG PO TABS: 10.0000 mg | ORAL_TABLET | Freq: Every evening | ORAL | Status: DC | PRN

## 2015-11-26 MED ORDER — MONTELUKAST SODIUM 10 MG PO TABS: 10.0000 mg | ORAL_TABLET | Freq: Every day | ORAL | Status: DC

## 2015-11-26 MED ORDER — FLUTICASONE PROPIONATE 50 MCG/ACT NA SUSP: 2.0000 | Freq: Every day | NASAL | Status: DC

## 2015-11-26 NOTE — Progress Notes (Signed)
  Subjective:     Rebecca Roman is a 49 y.o. female and is here for a comprehensive physical exam. The patient reports no problems. No cycle in 4 months.  Has IUD in place.  Social History   Social History  . Marital Status: Single    Spouse Name: N/A  . Number of Children: N/A  . Years of Education: N/A   Occupational History  . Not on file.   Social History Main Topics  . Smoking status: Never Smoker   . Smokeless tobacco: Never Used  . Alcohol Use: No  . Drug Use: No  . Sexual Activity:    Partners: Male    Birth Control/ Protection: IUD   Other Topics Concern  . Not on file   Social History Narrative   Health Maintenance  Topic Date Due  . INFLUENZA VACCINE  07/12/2015  . PAP SMEAR  11/24/2017  . TETANUS/TDAP  07/28/2021  . HIV Screening  Completed    The following portions of the patient's history were reviewed and updated as appropriate: allergies, current medications, past family history, past medical history, past social history, past surgical history and problem list.  Review of Systems Pertinent items noted in HPI and remainder of comprehensive ROS otherwise negative.   Objective:    BP 140/90 mmHg  Pulse 92  Resp 18  Ht 5\' 8"  (1.727 m)  Wt 150 lb (68.04 kg)  BMI 22.81 kg/m2  LMP 11/03/2014 General appearance: alert, cooperative and appears stated age Head: Normocephalic, without obvious abnormality, atraumatic Neck: no adenopathy, supple, symmetrical, trachea midline and thyroid not enlarged, symmetric, no tenderness/mass/nodules Lungs: clear to auscultation bilaterally Breasts: normal appearance, no masses or tenderness Heart: regular rate and rhythm, S1, S2 normal, no murmur, click, rub or gallop Abdomen: soft, non-tender; bowel sounds normal; no masses,  no organomegaly Pelvic: cervix normal in appearance, external genitalia normal, no adnexal masses or tenderness, no cervical motion tenderness, uterus normal size, shape, and consistency, vagina  normal without discharge and IUD strings are not seen Extremities: extremities normal, atraumatic, no cyanosis or edema Pulses: 2+ and symmetric Skin: Skin color, texture, turgor normal. No rashes or lesions Lymph nodes: Cervical, supraclavicular, and axillary nodes normal. Neurologic: Grossly normal    Assessment:    Healthy female exam. BP is slightly elevated due to stress today. I have reviewed BP's with giving blood over last 1 year and they are normal.     Plan:   Problem List Items Addressed This Visit    None    Visit Diagnoses    Encounter for routine gynecological examination    -  Primary    Relevant Orders    Cytology - PAP    MM DIGITAL SCREENING BILATERAL    CBC (Completed)    Comprehensive metabolic panel (Completed)    TSH (Completed)    Lipid panel (Completed)    Seasonal allergies        Relevant Medications    montelukast (SINGULAIR) 10 MG tablet    fluticasone (FLONASE) 50 MCG/ACT nasal spray         See After Visit Summary for Counseling Recommendations

## 2015-11-26 NOTE — Patient Instructions (Signed)
Preventive Care for Adults, Female A healthy lifestyle and preventive care can promote health and wellness. Preventive health guidelines for women include the following key practices.  A routine yearly physical is a good way to check with your health care provider about your health and preventive screening. It is a chance to share any concerns and updates on your health and to receive a thorough exam.  Visit your dentist for a routine exam and preventive care every 6 months. Brush your teeth twice a day and floss once a day. Good oral hygiene prevents tooth decay and gum disease.  The frequency of eye exams is based on your age, health, family medical history, use of contact lenses, and other factors. Follow your health care provider's recommendations for frequency of eye exams.  Eat a healthy diet. Foods like vegetables, fruits, whole grains, low-fat dairy products, and lean protein foods contain the nutrients you need without too many calories. Decrease your intake of foods high in solid fats, added sugars, and salt. Eat the right amount of calories for you.Get information about a proper diet from your health care provider, if necessary.  Regular physical exercise is one of the most important things you can do for your health. Most adults should get at least 150 minutes of moderate-intensity exercise (any activity that increases your heart rate and causes you to sweat) each week. In addition, most adults need muscle-strengthening exercises on 2 or more days a week.  Maintain a healthy weight. The body mass index (BMI) is a screening tool to identify possible weight problems. It provides an estimate of body fat based on height and weight. Your health care provider can find your BMI and can help you achieve or maintain a healthy weight.For adults 20 years and older:  A BMI below 18.5 is considered underweight.  A BMI of 18.5 to 24.9 is normal.  A BMI of 25 to 29.9 is considered overweight.  A  BMI of 30 and above is considered obese.  Maintain normal blood lipids and cholesterol levels by exercising and minimizing your intake of saturated fat. Eat a balanced diet with plenty of fruit and vegetables. Blood tests for lipids and cholesterol should begin at age 45 and be repeated every 5 years. If your lipid or cholesterol levels are high, you are over 50, or you are at high risk for heart disease, you may need your cholesterol levels checked more frequently.Ongoing high lipid and cholesterol levels should be treated with medicines if diet and exercise are not working.  If you smoke, find out from your health care provider how to quit. If you do not use tobacco, do not start.  Lung cancer screening is recommended for adults aged 45-80 years who are at high risk for developing lung cancer because of a history of smoking. A yearly low-dose CT scan of the lungs is recommended for people who have at least a 30-pack-year history of smoking and are a current smoker or have quit within the past 15 years. A pack year of smoking is smoking an average of 1 pack of cigarettes a day for 1 year (for example: 1 pack a day for 30 years or 2 packs a day for 15 years). Yearly screening should continue until the smoker has stopped smoking for at least 15 years. Yearly screening should be stopped for people who develop a health problem that would prevent them from having lung cancer treatment.  If you are pregnant, do not drink alcohol. If you are  breastfeeding, be very cautious about drinking alcohol. If you are not pregnant and choose to drink alcohol, do not have more than 1 drink per day. One drink is considered to be 12 ounces (355 mL) of beer, 5 ounces (148 mL) of wine, or 1.5 ounces (44 mL) of liquor.  Avoid use of street drugs. Do not share needles with anyone. Ask for help if you need support or instructions about stopping the use of drugs.  High blood pressure causes heart disease and increases the risk  of stroke. Your blood pressure should be checked at least every 1 to 2 years. Ongoing high blood pressure should be treated with medicines if weight loss and exercise do not work.  If you are 55-79 years old, ask your health care provider if you should take aspirin to prevent strokes.  Diabetes screening is done by taking a blood sample to check your blood glucose level after you have not eaten for a certain period of time (fasting). If you are not overweight and you do not have risk factors for diabetes, you should be screened once every 3 years starting at age 45. If you are overweight or obese and you are 40-70 years of age, you should be screened for diabetes every year as part of your cardiovascular risk assessment.  Breast cancer screening is essential preventive care for women. You should practice "breast self-awareness." This means understanding the normal appearance and feel of your breasts and may include breast self-examination. Any changes detected, no matter how small, should be reported to a health care provider. Women in their 20s and 30s should have a clinical breast exam (CBE) by a health care provider as part of a regular health exam every 1 to 3 years. After age 40, women should have a CBE every year. Starting at age 40, women should consider having a mammogram (breast X-ray test) every year. Women who have a family history of breast cancer should talk to their health care provider about genetic screening. Women at a high risk of breast cancer should talk to their health care providers about having an MRI and a mammogram every year.  Breast cancer gene (BRCA)-related cancer risk assessment is recommended for women who have family members with BRCA-related cancers. BRCA-related cancers include breast, ovarian, tubal, and peritoneal cancers. Having family members with these cancers may be associated with an increased risk for harmful changes (mutations) in the breast cancer genes BRCA1 and  BRCA2. Results of the assessment will determine the need for genetic counseling and BRCA1 and BRCA2 testing.  Your health care provider may recommend that you be screened regularly for cancer of the pelvic organs (ovaries, uterus, and vagina). This screening involves a pelvic examination, including checking for microscopic changes to the surface of your cervix (Pap test). You may be encouraged to have this screening done every 3 years, beginning at age 21.  For women ages 30-65, health care providers may recommend pelvic exams and Pap testing every 3 years, or they may recommend the Pap and pelvic exam, combined with testing for human papilloma virus (HPV), every 5 years. Some types of HPV increase your risk of cervical cancer. Testing for HPV may also be done on women of any age with unclear Pap test results.  Other health care providers may not recommend any screening for nonpregnant women who are considered low risk for pelvic cancer and who do not have symptoms. Ask your health care provider if a screening pelvic exam is right for   you.  If you have had past treatment for cervical cancer or a condition that could lead to cancer, you need Pap tests and screening for cancer for at least 20 years after your treatment. If Pap tests have been discontinued, your risk factors (such as having a new sexual partner) need to be reassessed to determine if screening should resume. Some women have medical problems that increase the chance of getting cervical cancer. In these cases, your health care provider may recommend more frequent screening and Pap tests.  Colorectal cancer can be detected and often prevented. Most routine colorectal cancer screening begins at the age of 50 years and continues through age 75 years. However, your health care provider may recommend screening at an earlier age if you have risk factors for colon cancer. On a yearly basis, your health care provider may provide home test kits to check  for hidden blood in the stool. Use of a small camera at the end of a tube, to directly examine the colon (sigmoidoscopy or colonoscopy), can detect the earliest forms of colorectal cancer. Talk to your health care provider about this at age 50, when routine screening begins. Direct exam of the colon should be repeated every 5-10 years through age 75 years, unless early forms of precancerous polyps or small growths are found.  People who are at an increased risk for hepatitis B should be screened for this virus. You are considered at high risk for hepatitis B if:  You were born in a country where hepatitis B occurs often. Talk with your health care provider about which countries are considered high risk.  Your parents were born in a high-risk country and you have not received a shot to protect against hepatitis B (hepatitis B vaccine).  You have HIV or AIDS.  You use needles to inject street drugs.  You live with, or have sex with, someone who has hepatitis B.  You get hemodialysis treatment.  You take certain medicines for conditions like cancer, organ transplantation, and autoimmune conditions.  Hepatitis C blood testing is recommended for all people born from 1945 through 1965 and any individual with known risks for hepatitis C.  Practice safe sex. Use condoms and avoid high-risk sexual practices to reduce the spread of sexually transmitted infections (STIs). STIs include gonorrhea, chlamydia, syphilis, trichomonas, herpes, HPV, and human immunodeficiency virus (HIV). Herpes, HIV, and HPV are viral illnesses that have no cure. They can result in disability, cancer, and death.  You should be screened for sexually transmitted illnesses (STIs) including gonorrhea and chlamydia if:  You are sexually active and are younger than 24 years.  You are older than 24 years and your health care provider tells you that you are at risk for this type of infection.  Your sexual activity has changed  since you were last screened and you are at an increased risk for chlamydia or gonorrhea. Ask your health care provider if you are at risk.  If you are at risk of being infected with HIV, it is recommended that you take a prescription medicine daily to prevent HIV infection. This is called preexposure prophylaxis (PrEP). You are considered at risk if:  You are sexually active and do not regularly use condoms or know the HIV status of your partner(s).  You take drugs by injection.  You are sexually active with a partner who has HIV.  Talk with your health care provider about whether you are at high risk of being infected with HIV. If   you choose to begin PrEP, you should first be tested for HIV. You should then be tested every 3 months for as long as you are taking PrEP.  Osteoporosis is a disease in which the bones lose minerals and strength with aging. This can result in serious bone fractures or breaks. The risk of osteoporosis can be identified using a bone density scan. Women ages 67 years and over and women at risk for fractures or osteoporosis should discuss screening with their health care providers. Ask your health care provider whether you should take a calcium supplement or vitamin D to reduce the rate of osteoporosis.  Menopause can be associated with physical symptoms and risks. Hormone replacement therapy is available to decrease symptoms and risks. You should talk to your health care provider about whether hormone replacement therapy is right for you.  Use sunscreen. Apply sunscreen liberally and repeatedly throughout the day. You should seek shade when your shadow is shorter than you. Protect yourself by wearing long sleeves, pants, a wide-brimmed hat, and sunglasses year round, whenever you are outdoors.  Once a month, do a whole body skin exam, using a mirror to look at the skin on your back. Tell your health care provider of new moles, moles that have irregular borders, moles that  are larger than a pencil eraser, or moles that have changed in shape or color.  Stay current with required vaccines (immunizations).  Influenza vaccine. All adults should be immunized every year.  Tetanus, diphtheria, and acellular pertussis (Td, Tdap) vaccine. Pregnant women should receive 1 dose of Tdap vaccine during each pregnancy. The dose should be obtained regardless of the length of time since the last dose. Immunization is preferred during the 27th-36th week of gestation. An adult who has not previously received Tdap or who does not know her vaccine status should receive 1 dose of Tdap. This initial dose should be followed by tetanus and diphtheria toxoids (Td) booster doses every 10 years. Adults with an unknown or incomplete history of completing a 3-dose immunization series with Td-containing vaccines should begin or complete a primary immunization series including a Tdap dose. Adults should receive a Td booster every 10 years.  Varicella vaccine. An adult without evidence of immunity to varicella should receive 2 doses or a second dose if she has previously received 1 dose. Pregnant females who do not have evidence of immunity should receive the first dose after pregnancy. This first dose should be obtained before leaving the health care facility. The second dose should be obtained 4-8 weeks after the first dose.  Human papillomavirus (HPV) vaccine. Females aged 13-26 years who have not received the vaccine previously should obtain the 3-dose series. The vaccine is not recommended for use in pregnant females. However, pregnancy testing is not needed before receiving a dose. If a female is found to be pregnant after receiving a dose, no treatment is needed. In that case, the remaining doses should be delayed until after the pregnancy. Immunization is recommended for any person with an immunocompromised condition through the age of 61 years if she did not get any or all doses earlier. During the  3-dose series, the second dose should be obtained 4-8 weeks after the first dose. The third dose should be obtained 24 weeks after the first dose and 16 weeks after the second dose.  Zoster vaccine. One dose is recommended for adults aged 30 years or older unless certain conditions are present.  Measles, mumps, and rubella (MMR) vaccine. Adults born  before 1957 generally are considered immune to measles and mumps. Adults born in 1957 or later should have 1 or more doses of MMR vaccine unless there is a contraindication to the vaccine or there is laboratory evidence of immunity to each of the three diseases. A routine second dose of MMR vaccine should be obtained at least 28 days after the first dose for students attending postsecondary schools, health care workers, or international travelers. People who received inactivated measles vaccine or an unknown type of measles vaccine during 1963-1967 should receive 2 doses of MMR vaccine. People who received inactivated mumps vaccine or an unknown type of mumps vaccine before 1979 and are at high risk for mumps infection should consider immunization with 2 doses of MMR vaccine. For females of childbearing age, rubella immunity should be determined. If there is no evidence of immunity, females who are not pregnant should be vaccinated. If there is no evidence of immunity, females who are pregnant should delay immunization until after pregnancy. Unvaccinated health care workers born before 1957 who lack laboratory evidence of measles, mumps, or rubella immunity or laboratory confirmation of disease should consider measles and mumps immunization with 2 doses of MMR vaccine or rubella immunization with 1 dose of MMR vaccine.  Pneumococcal 13-valent conjugate (PCV13) vaccine. When indicated, a person who is uncertain of his immunization history and has no record of immunization should receive the PCV13 vaccine. All adults 65 years of age and older should receive this  vaccine. An adult aged 19 years or older who has certain medical conditions and has not been previously immunized should receive 1 dose of PCV13 vaccine. This PCV13 should be followed with a dose of pneumococcal polysaccharide (PPSV23) vaccine. Adults who are at high risk for pneumococcal disease should obtain the PPSV23 vaccine at least 8 weeks after the dose of PCV13 vaccine. Adults older than 48 years of age who have normal immune system function should obtain the PPSV23 vaccine dose at least 1 year after the dose of PCV13 vaccine.  Pneumococcal polysaccharide (PPSV23) vaccine. When PCV13 is also indicated, PCV13 should be obtained first. All adults aged 65 years and older should be immunized. An adult younger than age 65 years who has certain medical conditions should be immunized. Any person who resides in a nursing home or long-term care facility should be immunized. An adult smoker should be immunized. People with an immunocompromised condition and certain other conditions should receive both PCV13 and PPSV23 vaccines. People with human immunodeficiency virus (HIV) infection should be immunized as soon as possible after diagnosis. Immunization during chemotherapy or radiation therapy should be avoided. Routine use of PPSV23 vaccine is not recommended for American Indians, [place] Natives, or people younger than 65 years unless there are medical conditions that require PPSV23 vaccine. When indicated, people who have unknown immunization and have no record of immunization should receive PPSV23 vaccine. One-time revaccination 5 years after the first dose of PPSV23 is recommended for people aged 19-64 years who have chronic kidney failure, nephrotic syndrome, asplenia, or immunocompromised conditions. People who received 1-2 doses of PPSV23 before age 65 years should receive another dose of PPSV23 vaccine at age 65 years or later if at least 5 years have passed since the previous dose. Doses of PPSV23 are not  needed for people immunized with PPSV23 at or after age 65 years.  Meningococcal vaccine. Adults with asplenia or persistent complement component deficiencies should receive 2 doses of quadrivalent meningococcal conjugate (MenACWY-D) vaccine. The doses should be obtained   at least 2 months apart. Microbiologists working with certain meningococcal bacteria, [place] recruits, people at risk during an outbreak, and people who travel to or live in countries with a high rate of meningitis should be immunized. A first-year college student up through age 34 years who is living in a residence hall should receive a dose if she did not receive a dose on or after her 16th birthday. Adults who have certain high-risk conditions should receive one or more doses of vaccine.  Hepatitis A vaccine. Adults who wish to be protected from this disease, have certain high-risk conditions, work with hepatitis A-infected animals, work in hepatitis A research labs, or travel to or work in countries with a high rate of hepatitis A should be immunized. Adults who were previously unvaccinated and who anticipate close contact with an international adoptee during the first 60 days after arrival in the [place] from a country with a high rate of hepatitis A should be immunized.  Hepatitis B vaccine. Adults who wish to be protected from this disease, have certain high-risk conditions, may be exposed to blood or other infectious body fluids, are household contacts or sex partners of hepatitis B positive people, are clients or workers in certain care facilities, or travel to or work in countries with a high rate of hepatitis B should be immunized.  Haemophilus influenzae type b (Hib) vaccine. A previously unvaccinated person with asplenia or sickle cell disease or having a scheduled splenectomy should receive 1 dose of Hib vaccine. Regardless of previous immunization, a recipient of a hematopoietic stem cell transplant should receive a  3-dose series 6-12 months after her successful transplant. Hib vaccine is not recommended for adults with HIV infection. Preventive Services / Frequency Ages 35 to 4 years  Blood pressure check.** / Every 3-5 years.  Lipid and cholesterol check.** / Every 5 years beginning at age 60.  Clinical breast exam.** / Every 3 years for women in their 71s and 10s.  BRCA-related cancer risk assessment.** / For women who have family members with a BRCA-related cancer (breast, ovarian, tubal, or peritoneal cancers).  Pap test.** / Every 2 years from ages 76 through 26. Every 3 years starting at age 61 through age 76 or 93 with a history of 3 consecutive normal Pap tests.  HPV screening.** / Every 3 years from ages 37 through ages 60 to 51 with a history of 3 consecutive normal Pap tests.  Hepatitis C blood test.** / For any individual with known risks for hepatitis C.  Skin self-exam. / Monthly.  Influenza vaccine. / Every year.  Tetanus, diphtheria, and acellular pertussis (Tdap, Td) vaccine.** / Consult your health care provider. Pregnant women should receive 1 dose of Tdap vaccine during each pregnancy. 1 dose of Td every 10 years.  Varicella vaccine.** / Consult your health care provider. Pregnant females who do not have evidence of immunity should receive the first dose after pregnancy.  HPV vaccine. / 3 doses over 6 months, if 93 and younger. The vaccine is not recommended for use in pregnant females. However, pregnancy testing is not needed before receiving a dose.  Measles, mumps, rubella (MMR) vaccine.** / You need at least 1 dose of MMR if you were born in 1957 or later. You may also need a 2nd dose. For females of childbearing age, rubella immunity should be determined. If there is no evidence of immunity, females who are not pregnant should be vaccinated. If there is no evidence of immunity, females who are  pregnant should delay immunization until after pregnancy.  Pneumococcal  13-valent conjugate (PCV13) vaccine.** / Consult your health care provider.  Pneumococcal polysaccharide (PPSV23) vaccine.** / 1 to 2 doses if you smoke cigarettes or if you have certain conditions.  Meningococcal vaccine.** / 1 dose if you are age 68 to 8 years and a Market researcher living in a residence hall, or have one of several medical conditions, you need to get vaccinated against meningococcal disease. You may also need additional booster doses.  Hepatitis A vaccine.** / Consult your health care provider.  Hepatitis B vaccine.** / Consult your health care provider.  Haemophilus influenzae type b (Hib) vaccine.** / Consult your health care provider. Ages 7 to 53 years  Blood pressure check.** / Every year.  Lipid and cholesterol check.** / Every 5 years beginning at age 25 years.  Lung cancer screening. / Every year if you are aged 11-80 years and have a 30-pack-year history of smoking and currently smoke or have quit within the past 15 years. Yearly screening is stopped once you have quit smoking for at least 15 years or develop a health problem that would prevent you from having lung cancer treatment.  Clinical breast exam.** / Every year after age 48 years.  BRCA-related cancer risk assessment.** / For women who have family members with a BRCA-related cancer (breast, ovarian, tubal, or peritoneal cancers).  Mammogram.** / Every year beginning at age 41 years and continuing for as long as you are in good health. Consult with your health care provider.  Pap test.** / Every 3 years starting at age 65 years through age 37 or 70 years with a history of 3 consecutive normal Pap tests.  HPV screening.** / Every 3 years from ages 72 years through ages 60 to 40 years with a history of 3 consecutive normal Pap tests.  Fecal occult blood test (FOBT) of stool. / Every year beginning at age 21 years and continuing until age 5 years. You may not need to do this test if you get  a colonoscopy every 10 years.  Flexible sigmoidoscopy or colonoscopy.** / Every 5 years for a flexible sigmoidoscopy or every 10 years for a colonoscopy beginning at age 35 years and continuing until age 48 years.  Hepatitis C blood test.** / For all people born from [birth year] through 1965 and any individual with known risks for hepatitis C.  Skin self-exam. / Monthly.  Influenza vaccine. / Every year.  Tetanus, diphtheria, and acellular pertussis (Tdap/Td) vaccine.** / Consult your health care provider. Pregnant women should receive 1 dose of Tdap vaccine during each pregnancy. 1 dose of Td every 10 years.  Varicella vaccine.** / Consult your health care provider. Pregnant females who do not have evidence of immunity should receive the first dose after pregnancy.  Zoster vaccine.** / 1 dose for adults aged 30 years or older.  Measles, mumps, rubella (MMR) vaccine.** / You need at least 1 dose of MMR if you were born in 1957 or later. You may also need a second dose. For females of childbearing age, rubella immunity should be determined. If there is no evidence of immunity, females who are not pregnant should be vaccinated. If there is no evidence of immunity, females who are pregnant should delay immunization until after pregnancy.  Pneumococcal 13-valent conjugate (PCV13) vaccine.** / Consult your health care provider.  Pneumococcal polysaccharide (PPSV23) vaccine.** / 1 to 2 doses if you smoke cigarettes or if you have certain conditions.  Meningococcal vaccine.** /  Consult your health care provider.  Hepatitis A vaccine.** / Consult your health care provider.  Hepatitis B vaccine.** / Consult your health care provider.  Haemophilus influenzae type b (Hib) vaccine.** / Consult your health care provider. Ages 64 years and over  Blood pressure check.** / Every year.  Lipid and cholesterol check.** / Every 5 years beginning at age 23 years.  Lung cancer screening. / Every year if you  are aged 16-80 years and have a 30-pack-year history of smoking and currently smoke or have quit within the past 15 years. Yearly screening is stopped once you have quit smoking for at least 15 years or develop a health problem that would prevent you from having lung cancer treatment.  Clinical breast exam.** / Every year after age 74 years.  BRCA-related cancer risk assessment.** / For women who have family members with a BRCA-related cancer (breast, ovarian, tubal, or peritoneal cancers).  Mammogram.** / Every year beginning at age 44 years and continuing for as long as you are in good health. Consult with your health care provider.  Pap test.** / Every 3 years starting at age 58 years through age 22 or 39 years with 3 consecutive normal Pap tests. Testing can be stopped between 65 and 70 years with 3 consecutive normal Pap tests and no abnormal Pap or HPV tests in the past 10 years.  HPV screening.** / Every 3 years from ages 64 years through ages 70 or 61 years with a history of 3 consecutive normal Pap tests. Testing can be stopped between 65 and 70 years with 3 consecutive normal Pap tests and no abnormal Pap or HPV tests in the past 10 years.  Fecal occult blood test (FOBT) of stool. / Every year beginning at age 40 years and continuing until age 27 years. You may not need to do this test if you get a colonoscopy every 10 years.  Flexible sigmoidoscopy or colonoscopy.** / Every 5 years for a flexible sigmoidoscopy or every 10 years for a colonoscopy beginning at age 7 years and continuing until age 32 years.  Hepatitis C blood test.** / For all people born from [birth year] through 1964 and any individual with known risks for hepatitis C.  Osteoporosis screening.** / A one-time screening for women ages 30 years and over and women at risk for fractures or osteoporosis.  Skin self-exam. / Monthly.  Influenza vaccine. / Every year.  Tetanus, diphtheria, and acellular pertussis (Tdap/Td)  vaccine.** / 1 dose of Td every 10 years.  Varicella vaccine.** / Consult your health care provider.  Zoster vaccine.** / 1 dose for adults aged 35 years or older.  Pneumococcal 13-valent conjugate (PCV13) vaccine.** / Consult your health care provider.  Pneumococcal polysaccharide (PPSV23) vaccine.** / 1 dose for all adults aged 46 years and older.  Meningococcal vaccine.** / Consult your health care provider.  Hepatitis A vaccine.** / Consult your health care provider.  Hepatitis B vaccine.** / Consult your health care provider.  Haemophilus influenzae type b (Hib) vaccine.** / Consult your health care provider. ** Family history and personal history of risk and conditions may change your health care provider's recommendations.   This information is not intended to replace advice given to you by your health care provider. Make sure you discuss any questions you have with your health care provider.   Document Released: 01/23/2002 Document Revised: 12/18/2014 Document Reviewed: 04/24/2011 Elsevier Interactive Patient Education [employer].

## 2015-11-27 LAB — TSH: TSH: 6.039 u[IU]/mL — ABNORMAL HIGH (ref 0.350–4.500)

## 2015-11-29 ENCOUNTER — Telehealth: Payer: Self-pay | Admitting: *Deleted

## 2015-11-29 LAB — T4, FREE: FREE T4: 0.96 ng/dL (ref 0.80–1.80)

## 2015-11-29 LAB — CYTOLOGY - PAP

## 2015-11-29 LAB — T3, FREE: T3 FREE: 3.5 pg/mL (ref 2.3–4.2)

## 2015-11-29 NOTE — Telephone Encounter (Signed)
Called [facility] to add-on Free T3 and Free T4 per Dr Kennon Rounds order due to abnormal TSH.

## 2015-11-29 NOTE — Telephone Encounter (Signed)
Spoke to pt about TSH result and recommendation to follow-up in 6 months for repeat labs to r/o hypothyroid.  Pt acknowledged and will call back in 6 months to schedule lab work.

## 2015-11-29 NOTE — Telephone Encounter (Signed)
-----   Message from Rebecca Jude, MD sent at 11/29/2015  1:12 PM EST ----- Her TSH is high, suggestive of hypothyroid, but her Free T3 and Free T4 are normal--so we should repeat these tests in 6 months. Can be early hypothyroid, but could just be lab error--repeating the sample, helps sort this out.

## 2015-11-29 NOTE — Telephone Encounter (Signed)
Called pt, no answer, left message to call the office.  

## 2015-11-29 NOTE — Telephone Encounter (Signed)
-----   Message from Donnamae Jude, MD sent at 11/29/2015  1:12 PM EST ----- Her TSH is high, suggestive of hypothyroid, but her Free T3 and Free T4 are normal--so we should repeat these tests in 6 months. Can be early hypothyroid, but could just be lab error--repeating the sample, helps sort this out.

## 2015-11-30 ENCOUNTER — Encounter: Payer: Self-pay | Admitting: *Deleted

## 2015-12-01 ENCOUNTER — Other Ambulatory Visit: Payer: Self-pay | Admitting: *Deleted

## 2015-12-01 ENCOUNTER — Inpatient Hospital Stay
Admission: RE | Admit: 2015-12-01 | Discharge: 2015-12-01 | Disposition: A | Discharge status: Home / Self Care | Payer: Self-pay | Source: Ambulatory Visit | Attending: *Deleted | Admitting: *Deleted

## 2015-12-01 DIAGNOSIS — Z9289 Personal history of other medical treatment: Secondary | ICD-10-CM

## 2015-12-07 ENCOUNTER — Encounter: Payer: Self-pay | Admitting: *Deleted

## 2015-12-09 ENCOUNTER — Ambulatory Visit
Admission: RE | Admit: 2015-12-09 | Discharge: 2015-12-09 | Disposition: A | Discharge status: Home / Self Care | Payer: BLUE CROSS/BLUE SHIELD | Source: Ambulatory Visit | Attending: Family Medicine | Admitting: Family Medicine

## 2015-12-09 DIAGNOSIS — Z01419 Encounter for gynecological examination (general) (routine) without abnormal findings: Secondary | ICD-10-CM

## 2015-12-09 DIAGNOSIS — Z1231 Encounter for screening mammogram for malignant neoplasm of breast: Secondary | ICD-10-CM | POA: Diagnosis present

## 2015-12-22 ENCOUNTER — Telehealth: Payer: Self-pay | Admitting: *Deleted

## 2015-12-22 NOTE — Telephone Encounter (Signed)
Received fax from [facility] for refill on Singulair, Dr Kennon Rounds had refilled on 11-26-15, sent refill to [facility].

## 2016-03-13 ENCOUNTER — Telehealth: Payer: Self-pay | Admitting: *Deleted

## 2016-03-13 DIAGNOSIS — J302 Other seasonal allergic rhinitis: Secondary | ICD-10-CM

## 2016-03-13 MED ORDER — FLUTICASONE PROPIONATE 50 MCG/ACT NA SUSP: 2.0000 | Freq: Every day | NASAL | Status: DC

## 2016-03-13 MED ORDER — MONTELUKAST SODIUM 10 MG PO TABS: 10.0000 mg | ORAL_TABLET | Freq: Every day | ORAL | Status: DC

## 2016-03-13 NOTE — Telephone Encounter (Signed)
Pt requesting Flonase and Singulair rx to be sent to [facility]. Sent rx to pharmacy.

## 2016-06-05 ENCOUNTER — Other Ambulatory Visit (INDEPENDENT_AMBULATORY_CARE_PROVIDER_SITE_OTHER): Payer: BLUE CROSS/BLUE SHIELD | Admitting: *Deleted

## 2016-06-05 ENCOUNTER — Other Ambulatory Visit: Payer: Self-pay | Admitting: Family Medicine

## 2016-06-05 DIAGNOSIS — Z0000 Encounter for general adult medical examination without abnormal findings: Secondary | ICD-10-CM

## 2016-06-05 DIAGNOSIS — Z01419 Encounter for gynecological examination (general) (routine) without abnormal findings: Secondary | ICD-10-CM

## 2016-06-05 LAB — CBC
HEMATOCRIT: 34.4 % — AB (ref 35.0–45.0)
Hemoglobin: 10.2 g/dL — ABNORMAL LOW (ref 11.7–15.5)
MCH: 24 pg — ABNORMAL LOW (ref 27.0–33.0)
MCHC: 29.7 g/dL — AB (ref 32.0–36.0)
MCV: 80.9 fL (ref 80.0–100.0)
MPV: 10.1 fL (ref 7.5–12.5)
PLATELETS: 380 10*3/uL (ref 140–400)
RBC: 4.25 MIL/uL (ref 3.80–5.10)
RDW: 15 % (ref 11.0–15.0)
WBC: 5.4 10*3/uL (ref 3.8–10.8)

## 2016-06-05 NOTE — Progress Notes (Signed)
Pt here to recheck fasting labs that were done 11/2015

## 2016-06-06 LAB — COMPREHENSIVE METABOLIC PANEL
ALK PHOS: 87 U/L (ref 33–115)
ALT: 12 U/L (ref 6–29)
AST: 27 U/L (ref 10–35)
Albumin: 4.2 g/dL (ref 3.6–5.1)
BILIRUBIN TOTAL: 0.4 mg/dL (ref 0.2–1.2)
BUN: 13 mg/dL (ref 7–25)
CALCIUM: 9.2 mg/dL (ref 8.6–10.2)
CO2: 27 mmol/L (ref 20–31)
Chloride: 105 mmol/L (ref 98–110)
Creat: 0.81 mg/dL (ref 0.50–1.10)
GLUCOSE: 90 mg/dL (ref 65–99)
POTASSIUM: 4.5 mmol/L (ref 3.5–5.3)
Sodium: 141 mmol/L (ref 135–146)
Total Protein: 7.2 g/dL (ref 6.1–8.1)

## 2016-06-06 LAB — T3, FREE: T3 FREE: 3.4 pg/mL (ref 2.3–4.2)

## 2016-06-06 LAB — LIPID PANEL
CHOL/HDL RATIO: 2.3 ratio (ref <=5.0)
CHOLESTEROL: 169 mg/dL (ref 125–200)
HDL: 74 mg/dL (ref >=46)
LDL Cholesterol: 77 mg/dL (ref <130)
Triglycerides: 90 mg/dL (ref <150)
VLDL: 18 mg/dL (ref <30)

## 2016-06-06 LAB — VITAMIN D 25 HYDROXY (VIT D DEFICIENCY, FRACTURES): Vit D, 25-Hydroxy: 35 ng/mL (ref 30–100)

## 2016-06-06 LAB — TSH: TSH: 4.99 m[IU]/L — AB

## 2016-06-06 LAB — T4, FREE: Free T4: 1.2 ng/dL (ref 0.8–1.8)

## 2016-06-07 ENCOUNTER — Telehealth: Payer: Self-pay | Admitting: *Deleted

## 2016-06-07 NOTE — Telephone Encounter (Signed)
Informed pt about Thyroid lab results and recommendation.  Also asked pt about bleeding profile and she states that she has not experienced any abnormal bleeding or any change in her menstrual cycles.  Pt states she gives blood regularly and her Hgb is always normal value to donate blood.  Informed pt to increase foods that are high in iron such as red meats and dark green leafy vegetables.  Pt acknowledged and stated she is due to come back in December.  Informed pt that I would check with Dr Kennon Rounds and see if we could re check levels then or if she wanted her to come in sooner I would call back her back and inform her.

## 2016-06-07 NOTE — Telephone Encounter (Signed)
-----   Message from Donnamae Jude, MD sent at 06/06/2016  5:49 PM EDT ----- Has subclinical hypothyroidism. Treatment is not recommended at this level, but we will continue to monitor it.  And some anemia--any bleeding lately?

## 2016-06-07 NOTE — Telephone Encounter (Signed)
-----   Message from Donnamae Jude, MD sent at 06/06/2016 10:41 AM EDT ----- Add free t 3 and free t4--ask about bleeding?

## 2016-06-19 ENCOUNTER — Other Ambulatory Visit: Payer: Self-pay | Admitting: *Deleted

## 2016-06-19 NOTE — Telephone Encounter (Signed)
Pt called requesting refill on Ambien to be sent to pharmacy.

## 2016-06-20 ENCOUNTER — Telehealth: Payer: Self-pay | Admitting: *Deleted

## 2016-06-20 MED ORDER — ZOLPIDEM TARTRATE 10 MG PO TABS: 10.0000 mg | ORAL_TABLET | Freq: Every evening | ORAL | Status: DC | PRN

## 2016-06-20 NOTE — Telephone Encounter (Signed)
-----   Message from Francia Greaves sent at 06/19/2016  8:31 AM EDT ----- Regarding: Refill Request Contact: [phone] Needs a refill on Ambien, also has symptoms of an early UTI (advised her that we like to get a urine sample before we could call her something in) [facility] [place]

## 2016-06-20 NOTE — Telephone Encounter (Signed)
Called rx for Ambien to pharmacy per Dr Nehemiah Settle order, notified pt.  Pt states she increased water intake and is no longer having any signs of a possible UTI, will call back with any changes.

## 2016-09-25 ENCOUNTER — Other Ambulatory Visit (INDEPENDENT_AMBULATORY_CARE_PROVIDER_SITE_OTHER): Payer: BLUE CROSS/BLUE SHIELD | Admitting: *Deleted

## 2016-09-25 DIAGNOSIS — N3001 Acute cystitis with hematuria: Secondary | ICD-10-CM

## 2016-09-25 DIAGNOSIS — R300 Dysuria: Secondary | ICD-10-CM

## 2016-09-25 LAB — POCT URINALYSIS DIPSTICK
BILIRUBIN UA: NEGATIVE
Glucose, UA: NEGATIVE
KETONES UA: NEGATIVE
Nitrite, UA: NEGATIVE
Protein, UA: NEGATIVE
Spec Grav, UA: 1.01
Urobilinogen, UA: 0.2
pH, UA: 6

## 2016-09-25 MED ORDER — FLUCONAZOLE 150 MG PO TABS: 150.0000 mg | ORAL_TABLET | Freq: Once | ORAL | 0 refills | Status: AC

## 2016-09-25 MED ORDER — PHENAZOPYRIDINE HCL 200 MG PO TABS: 200.0000 mg | ORAL_TABLET | Freq: Three times a day (TID) | ORAL | 0 refills | Status: DC | PRN

## 2016-09-25 MED ORDER — SULFAMETHOXAZOLE-TRIMETHOPRIM 800-160 MG PO TABS: 1.0000 | ORAL_TABLET | Freq: Two times a day (BID) | ORAL | 0 refills | Status: DC

## 2016-09-25 NOTE — Progress Notes (Signed)
Pt here today c/o pain with urination and lower back pain for the past few days, also had a febrile episode on Saturday.  Moderate blood and leukocytes noted on urine dipstick, will send urine cx for confirmation.  Sent Bactrim and Pyridium to the pharmacy and instructed pt on medication use as well as encouraged to increase water intake.  Pt requested Diflucan be sent to the pharmacy to help with yeast infection associated with antibiotic use, medication sent to pharmacy.

## 2016-09-28 ENCOUNTER — Telehealth: Payer: Self-pay | Admitting: *Deleted

## 2016-09-28 DIAGNOSIS — N3001 Acute cystitis with hematuria: Secondary | ICD-10-CM

## 2016-09-28 LAB — URINE CULTURE

## 2016-09-28 MED ORDER — SULFAMETHOXAZOLE-TRIMETHOPRIM 800-160 MG PO TABS: 1.0000 | ORAL_TABLET | Freq: Two times a day (BID) | ORAL | 0 refills | Status: DC

## 2016-09-28 NOTE — Telephone Encounter (Signed)
-----   Message from Francia Greaves sent at 09/28/2016  2:19 PM EDT ----- Regarding: Lab Results Contact: [phone] Came in on Monday for a UTI, taking abx, feels better but not 100%, wants to know if her culture came back

## 2016-09-28 NOTE — Telephone Encounter (Signed)
Spoke to pt who states she completed the 3 day course of Bactrim and still feels UTI symptoms but not as strong as before. Explained to pt that the amount of bacteria that grew on urice culture was not significant but that she could do another 4 days of that abx to see if Sx were relieved. Pt was thankful and expressed understanding.

## 2016-10-05 ENCOUNTER — Telehealth: Payer: Self-pay | Admitting: *Deleted

## 2016-10-05 DIAGNOSIS — B379 Candidiasis, unspecified: Secondary | ICD-10-CM

## 2016-10-05 MED ORDER — FLUCONAZOLE 150 MG PO TABS: 150.0000 mg | ORAL_TABLET | Freq: Once | ORAL | 0 refills | Status: AC

## 2016-10-05 NOTE — Telephone Encounter (Signed)
Pt was just recently treated with antibiotic for dysuria and possible UTI, c/o yeast infection, sent Diflucan to the pharmacy.

## 2016-10-05 NOTE — Telephone Encounter (Signed)
-----   Message from Francia Greaves sent at 10/05/2016 10:07 AM EDT ----- Regarding: Rx Request Contact: [phone] Feels like she needs another Diflucan  [facility] [place]

## 2016-11-29 ENCOUNTER — Ambulatory Visit (INDEPENDENT_AMBULATORY_CARE_PROVIDER_SITE_OTHER): Payer: BLUE CROSS/BLUE SHIELD | Admitting: Obstetrics & Gynecology

## 2016-11-29 ENCOUNTER — Encounter: Payer: Self-pay | Admitting: Obstetrics & Gynecology

## 2016-11-29 VITALS — BP 146/93 | HR 89 | Ht 68.0 in | Wt 146.0 lb

## 2016-11-29 DIAGNOSIS — Z113 Encounter for screening for infections with a predominantly sexual mode of transmission: Secondary | ICD-10-CM | POA: Diagnosis not present

## 2016-11-29 DIAGNOSIS — Z0000 Encounter for general adult medical examination without abnormal findings: Secondary | ICD-10-CM

## 2016-11-29 DIAGNOSIS — N760 Acute vaginitis: Secondary | ICD-10-CM

## 2016-11-29 DIAGNOSIS — Z124 Encounter for screening for malignant neoplasm of cervix: Secondary | ICD-10-CM | POA: Diagnosis not present

## 2016-11-29 DIAGNOSIS — B9689 Other specified bacterial agents as the cause of diseases classified elsewhere: Secondary | ICD-10-CM

## 2016-11-29 DIAGNOSIS — Z01419 Encounter for gynecological examination (general) (routine) without abnormal findings: Secondary | ICD-10-CM | POA: Diagnosis not present

## 2016-11-29 DIAGNOSIS — Z1151 Encounter for screening for human papillomavirus (HPV): Secondary | ICD-10-CM

## 2016-11-29 LAB — COMPREHENSIVE METABOLIC PANEL
ALK PHOS: 80 U/L (ref 33–115)
ALT: 9 U/L (ref 6–29)
AST: 15 U/L (ref 10–35)
Albumin: 4 g/dL (ref 3.6–5.1)
BILIRUBIN TOTAL: 0.4 mg/dL (ref 0.2–1.2)
BUN: 16 mg/dL (ref 7–25)
CALCIUM: 9.2 mg/dL (ref 8.6–10.2)
CO2: 27 mmol/L (ref 20–31)
Chloride: 105 mmol/L (ref 98–110)
Creat: 0.86 mg/dL (ref 0.50–1.10)
Glucose, Bld: 86 mg/dL (ref 65–99)
POTASSIUM: 4.4 mmol/L (ref 3.5–5.3)
Sodium: 141 mmol/L (ref 135–146)
TOTAL PROTEIN: 7 g/dL (ref 6.1–8.1)

## 2016-11-29 LAB — CBC
HEMATOCRIT: 40 % (ref 35.0–45.0)
Hemoglobin: 12.4 g/dL (ref 11.7–15.5)
MCH: 24.8 pg — ABNORMAL LOW (ref 27.0–33.0)
MCHC: 31 g/dL — ABNORMAL LOW (ref 32.0–36.0)
MCV: 79.8 fL — AB (ref 80.0–100.0)
MPV: 10.2 fL (ref 7.5–12.5)
PLATELETS: 362 10*3/uL (ref 140–400)
RBC: 5.01 MIL/uL (ref 3.80–5.10)
RDW: 18.7 % — ABNORMAL HIGH (ref 11.0–15.0)
WBC: 4.2 10*3/uL (ref 3.8–10.8)

## 2016-11-29 LAB — T4, FREE: Free T4: 1.1 ng/dL (ref 0.8–1.8)

## 2016-11-29 LAB — POCT URINALYSIS DIPSTICK
Bilirubin, UA: NEGATIVE
Blood, UA: NEGATIVE
Glucose, UA: NEGATIVE
KETONES UA: NEGATIVE
LEUKOCYTES UA: NEGATIVE
Nitrite, UA: NEGATIVE
Protein, UA: NEGATIVE
SPEC GRAV UA: 1.01
Urobilinogen, UA: 0.2
pH, UA: 6.5

## 2016-11-29 LAB — LIPID PANEL
CHOL/HDL RATIO: 2.6 ratio (ref <5.0)
CHOLESTEROL: 154 mg/dL (ref <200)
HDL: 59 mg/dL (ref >50)
LDL Cholesterol: 82 mg/dL (ref <100)
Triglycerides: 67 mg/dL (ref <150)
VLDL: 13 mg/dL (ref <30)

## 2016-11-29 LAB — TSH: TSH: 3.79 mIU/L

## 2016-11-29 LAB — T3, FREE: T3 FREE: 4.1 pg/mL (ref 2.3–4.2)

## 2016-11-29 MED ORDER — METRONIDAZOLE 500 MG PO TABS: 500.0000 mg | ORAL_TABLET | Freq: Two times a day (BID) | ORAL | 0 refills | Status: DC

## 2016-11-29 NOTE — Progress Notes (Signed)
Subjective:    Rebecca Roman is a 50 y.o. SW P2 female who presents for an annual exam. The patient has no complaints today. The patient is sexually active. GYN screening history: last pap: was normal. The patient wears seatbelts: yes. The patient participates in regular exercise: yes. Has the patient ever been transfused or tattooed?: no. The patient reports that there is not domestic violence in her life.   Menstrual History: OB History    Gravida Para Term Preterm AB Living   3 2 2  0 0 1   SAB TAB Ectopic Multiple Live Births   0 0 0 0 1      Menarche age: 65 No LMP recorded. Patient is not currently having periods (Reason: IUD).    The following portions of the patient's history were reviewed and updated as appropriate: allergies, current medications, past family history, past medical history, past social history, past surgical history and problem list.  Review of Systems Pertinent items are noted in HPI.   She has been monogamous for about 6 months. She had her flu vaccine 11/17   Objective:    BP (!) 146/93   Pulse 89   Ht 5\' 8"  (1.727 m)   Wt 146 lb (66.2 kg)   BMI 22.20 kg/m   General Appearance:    Alert, cooperative, no distress, appears stated age  Head:    Normocephalic, without obvious abnormality, atraumatic  Eyes:    PERRL, conjunctiva/corneas clear, EOM's intact, fundi    benign, both eyes  Ears:    Normal TM's and external ear canals, both ears  Nose:   Nares normal, septum midline, mucosa normal, no drainage    or sinus tenderness  Throat:   Lips, mucosa, and tongue normal; teeth and gums normal  Neck:   Supple, symmetrical, trachea midline, no adenopathy;    thyroid:  no enlargement/tenderness/nodules; no carotid   bruit or JVD  Back:     Symmetric, no curvature, ROM normal, no CVA tenderness  Lungs:     Clear to auscultation bilaterally, respirations unlabored  Chest Wall:    No tenderness or deformity   Heart:    Regular rate and rhythm, S1 and S2  normal, no murmur, rub   or gallop  Breast Exam:    No tenderness, masses, or nipple abnormality  Abdomen:     Soft, non-tender, bowel sounds active all four quadrants,    no masses, no organomegaly  Genitalia:    Normal female without lesion, discharge or tenderness, NSSA, NT, mobile, no adnexal masses, thinnish white discharge, her IUD strings are very short and are palpable (as her boyfriend complains about), I tried to push them further up into the cervical os, but they did not move.     Extremities:   Extremities normal, atraumatic, no cyanosis or edema  Pulses:   2+ and symmetric all extremities  Skin:   Skin color, texture, turgor normal, no rashes or lesions  Lymph nodes:   Cervical, supraclavicular, and axillary nodes normal  Neurologic:   CNII-XII intact, normal strength, sensation and reflexes    throughout   .    Assessment:    Healthy female exam.   Soon to expire Mirena   Plan:     Thin prep Pap smear. with cotesting (she is aware of ACOG recs) Wet prep RTC for new Mirena Fasting labs Mammogram

## 2016-11-30 LAB — WET PREP, GENITAL
Trich, Wet Prep: NONE SEEN
Yeast Wet Prep HPF POC: NONE SEEN

## 2016-11-30 LAB — CYTOLOGY - PAP
Chlamydia: NEGATIVE
DIAGNOSIS: NEGATIVE
HPV (WINDOPATH): DETECTED — AB
NEISSERIA GONORRHEA: NEGATIVE

## 2016-11-30 LAB — VITAMIN D 25 HYDROXY (VIT D DEFICIENCY, FRACTURES): Vit D, 25-Hydroxy: 40 ng/mL (ref 30–100)

## 2016-12-05 ENCOUNTER — Telehealth: Payer: Self-pay | Admitting: *Deleted

## 2016-12-05 NOTE — Telephone Encounter (Signed)
Called pt to go over labs and to notify about Rx sent to pharmacy. Pt expressed understanding.

## 2016-12-14 ENCOUNTER — Ambulatory Visit: Admission: RE | Admit: 2016-12-14 | Payer: BLUE CROSS/BLUE SHIELD | Source: Ambulatory Visit

## 2016-12-18 ENCOUNTER — Telehealth: Payer: Self-pay | Admitting: *Deleted

## 2016-12-18 DIAGNOSIS — G4700 Insomnia, unspecified: Secondary | ICD-10-CM

## 2016-12-18 MED ORDER — ZOLPIDEM TARTRATE 5 MG PO TABS: 5.0000 mg | ORAL_TABLET | Freq: Every evening | ORAL | 4 refills | Status: DC | PRN

## 2016-12-18 NOTE — Telephone Encounter (Signed)
Refill sent to pharmacy per Dr Hulan Fray order, phoned in.

## 2016-12-18 NOTE — Telephone Encounter (Signed)
-----   Message from Francia Greaves sent at 12/18/2016  1:25 PM EST ----- Regarding: Refill Request Contact: [phone] Needs refill on Ambien (completed AE on 11/29/2016 w/ [facility]) [facility] [place]

## 2016-12-19 ENCOUNTER — Telehealth: Payer: Self-pay | Admitting: *Deleted

## 2016-12-19 DIAGNOSIS — G4700 Insomnia, unspecified: Secondary | ICD-10-CM

## 2016-12-19 MED ORDER — SUVOREXANT 10 MG PO TABS: 10.0000 mg | ORAL_TABLET | Freq: Every day | ORAL | 4 refills | Status: DC

## 2016-12-19 NOTE — Telephone Encounter (Signed)
Had sent refill on Ambien 5mg  and notified pt of dosage decrease due to warning.  Pt states that Ambien does not always work for her so she would like to try something else since we had to decrease the dosage.  Phoned in rx for Belsomra for pt to try per Dr Hulan Fray order.

## 2016-12-21 ENCOUNTER — Other Ambulatory Visit: Payer: Self-pay | Admitting: Obstetrics & Gynecology

## 2016-12-21 ENCOUNTER — Ambulatory Visit
Admission: RE | Admit: 2016-12-21 | Discharge: 2016-12-21 | Disposition: A | Discharge status: Home / Self Care | Payer: BLUE CROSS/BLUE SHIELD | Source: Ambulatory Visit | Attending: Obstetrics & Gynecology | Admitting: Obstetrics & Gynecology

## 2016-12-21 DIAGNOSIS — Z1231 Encounter for screening mammogram for malignant neoplasm of breast: Secondary | ICD-10-CM | POA: Insufficient documentation

## 2016-12-21 DIAGNOSIS — Z01419 Encounter for gynecological examination (general) (routine) without abnormal findings: Secondary | ICD-10-CM

## 2016-12-21 HISTORY — DX: Malignant (primary) neoplasm, unspecified: C80.1

## 2016-12-26 ENCOUNTER — Encounter: Payer: Self-pay | Admitting: Family Medicine

## 2016-12-26 ENCOUNTER — Ambulatory Visit (INDEPENDENT_AMBULATORY_CARE_PROVIDER_SITE_OTHER): Payer: BLUE CROSS/BLUE SHIELD | Admitting: Family Medicine

## 2016-12-26 ENCOUNTER — Encounter: Payer: Self-pay | Admitting: *Deleted

## 2016-12-26 VITALS — BP 138/93 | HR 84 | Resp 18 | Wt 154.0 lb

## 2016-12-26 DIAGNOSIS — G4700 Insomnia, unspecified: Secondary | ICD-10-CM

## 2016-12-26 DIAGNOSIS — Z30433 Encounter for removal and reinsertion of intrauterine contraceptive device: Secondary | ICD-10-CM | POA: Diagnosis not present

## 2016-12-26 MED ORDER — ZOLPIDEM TARTRATE 10 MG PO TABS: 10.0000 mg | ORAL_TABLET | Freq: Every evening | ORAL | 5 refills | Status: DC | PRN

## 2016-12-26 MED ORDER — LEVONORGESTREL 18.6 MCG/DAY IU IUD
INTRAUTERINE_SYSTEM | Freq: Once | INTRAUTERINE | Status: AC
  Administered 2016-12-26: 12:00:00 via INTRAUTERINE

## 2016-12-26 NOTE — Progress Notes (Signed)
   Subjective: Patient here for IUD change.  Has been in for 4 years and it is also located in the cervix so needs to be replaced. She has no cycles and desires to continue [place] IUD for contraception. Additionally, she was recently changed to Ambien 5 mg and she cannot get any rest on this. She was tried on Belsomra which did not work at all for her. She is not impaired on rising on 10 mg Ambien and has no sleep walking, eating, issues. She is due for sleep study this year.  Objective: Vitals:   12/26/16 1038  BP: (!) 138/93  Pulse: 84  Resp: 18    Patient identified, informed consent performed, signed copy in chart, time out was performed Procedure: Speculum placed inside vagina.  Cervix visualized.  Strings grasped with ring forceps.  IUD removed intact.  Procedure: Cervix visualized.  Cleaned with Betadine x 2.  Grasped anteriorly with a single tooth tenaculum.  Uterus sounded to 7 cm.  Liletta IUD placed per manufacturer's recommendations.  Strings trimmed to 3 cm.   Patient given post procedure instructions and Liletta care card with expiration date.  Patient is asked to check IUD strings periodically and follow up in 4-6 weeks for IUD check.  Impression: Encounter for IUD removal and reinsertion - Plan: levonorgestrel (LILETTA) 18.6 MCG/DAY IUD  Insomnia, unspecified type - Plan: zolpidem (AMBIEN) 10 MG tablet, DISCONTINUED: zolpidem (AMBIEN) 10 MG tablet   Plan: Continue IUD for 5 years. Increase Ambien to 10 mg which she has previously tolerated well. Go to sleep study.

## 2016-12-26 NOTE — Patient Instructions (Signed)
Insomnia Insomnia is a sleep disorder that makes it difficult to fall asleep or to stay asleep. Insomnia can cause tiredness (fatigue), low energy, difficulty concentrating, mood swings, and poor performance at work or school. There are three different ways to classify insomnia:  Difficulty falling asleep.  Difficulty staying asleep.  Waking up too early in the morning. Any type of insomnia can be long-term (chronic) or short-term (acute). Both are common. Short-term insomnia usually lasts for three months or less. Chronic insomnia occurs at least three times a week for longer than three months. What are the causes? Insomnia may be caused by another condition, situation, or substance, such as:  Anxiety.  Certain medicines.  Gastroesophageal reflux disease (GERD) or other gastrointestinal conditions.  Asthma or other breathing conditions.  Restless legs syndrome, sleep apnea, or other sleep disorders.  Chronic pain.  Menopause. This may include hot flashes.  Stroke.  Abuse of alcohol, tobacco, or illegal drugs.  Depression.  Caffeine.  Neurological disorders, such as Alzheimer disease.  An overactive thyroid (hyperthyroidism). The cause of insomnia may not be known. What increases the risk? Risk factors for insomnia include:  Gender. Women are more commonly affected than men.  Age. Insomnia is more common as you get older.  Stress. This may involve your professional or personal life.  Income. Insomnia is more common in people with lower income.  Lack of exercise.  Irregular work schedule or night shifts.  Traveling between different time zones. What are the signs or symptoms? If you have insomnia, trouble falling asleep or trouble staying asleep is the main symptom. This may lead to other symptoms, such as:  Feeling fatigued.  Feeling nervous about going to sleep.  Not feeling rested in the morning.  Having trouble concentrating.  Feeling irritable,  anxious, or depressed. How is this treated? Treatment for insomnia depends on the cause. If your insomnia is caused by an underlying condition, treatment will focus on addressing the condition. Treatment may also include:  Medicines to help you sleep.  Counseling or therapy.  Lifestyle adjustments. Follow these instructions at home:  Take medicines only as directed by your health care provider.  Keep regular sleeping and waking hours. Avoid naps.  Keep a sleep diary to help you and your health care provider figure out what could be causing your insomnia. Include:  When you sleep.  When you wake up during the night.  How well you sleep.  How rested you feel the next day.  Any side effects of medicines you are taking.  What you eat and drink.  Make your bedroom a comfortable place where it is easy to fall asleep:  Put up shades or special blackout curtains to block light from outside.  Use a white noise machine to block noise.  Keep the temperature cool.  Exercise regularly as directed by your health care provider. Avoid exercising right before bedtime.  Use relaxation techniques to manage stress. Ask your health care provider to suggest some techniques that may work well for you. These may include:  Breathing exercises.  Routines to release muscle tension.  Visualizing peaceful scenes.  Cut back on alcohol, caffeinated beverages, and cigarettes, especially close to bedtime. These can disrupt your sleep.  Do not overeat or eat spicy foods right before bedtime. This can lead to digestive discomfort that can make it hard for you to sleep.  Limit screen use before bedtime. This includes:  Watching TV.  Using your smartphone, tablet, and computer.  Stick to a   routine. This can help you fall asleep faster. Try to do a quiet activity, brush your teeth, and go to bed at the same time each night.  Get out of bed if you are still awake after 15 minutes of trying to  sleep. Keep the lights down, but try reading or doing a quiet activity. When you feel sleepy, go back to bed.  Make sure that you drive carefully. Avoid driving if you feel very sleepy.  Keep all follow-up appointments as directed by your health care provider. This is important. Contact a health care provider if:  You are tired throughout the day or have trouble in your daily routine due to sleepiness.  You continue to have sleep problems or your sleep problems get worse. Get help right away if:  You have serious thoughts about hurting yourself or someone else. This information is not intended to replace advice given to you by your health care provider. Make sure you discuss any questions you have with your health care provider. Document Released: 11/24/2000 Document Revised: 04/28/2016 Document Reviewed: 08/28/2014 Elsevier Interactive Patient Education  2017 [employer]  

## 2017-02-14 ENCOUNTER — Telehealth: Payer: Self-pay | Admitting: *Deleted

## 2017-02-14 DIAGNOSIS — J302 Other seasonal allergic rhinitis: Secondary | ICD-10-CM

## 2017-02-14 MED ORDER — MONTELUKAST SODIUM 10 MG PO TABS: 10.0000 mg | ORAL_TABLET | Freq: Every day | ORAL | 3 refills | Status: AC

## 2017-02-14 NOTE — Telephone Encounter (Signed)
Refill for Singulair e-prescribed per Dr Kennon Rounds order.

## 2017-06-29 ENCOUNTER — Other Ambulatory Visit: Payer: Self-pay | Admitting: Family Medicine

## 2017-06-29 DIAGNOSIS — G4700 Insomnia, unspecified: Secondary | ICD-10-CM

## 2017-06-29 NOTE — Telephone Encounter (Signed)
Ok to refill for patient? ?

## 2017-07-27 ENCOUNTER — Other Ambulatory Visit: Payer: Self-pay | Admitting: *Deleted

## 2017-07-27 DIAGNOSIS — G4700 Insomnia, unspecified: Secondary | ICD-10-CM

## 2017-07-27 MED ORDER — ZOLPIDEM TARTRATE 5 MG PO TABS: 5.0000 mg | ORAL_TABLET | Freq: Every evening | ORAL | 3 refills | Status: DC | PRN

## 2017-07-27 NOTE — Telephone Encounter (Signed)
Ambien 5mg  [facility]

## 2017-07-30 ENCOUNTER — Telehealth: Payer: Self-pay

## 2017-07-30 DIAGNOSIS — G4700 Insomnia, unspecified: Secondary | ICD-10-CM

## 2017-07-30 MED ORDER — ZOLPIDEM TARTRATE 10 MG PO TABS: 10.0000 mg | ORAL_TABLET | Freq: Every evening | ORAL | 3 refills | Status: DC | PRN

## 2017-07-30 NOTE — Telephone Encounter (Signed)
Informed patient that 10mg  ambien sent to pharmacy.  Patient says she does not have any issues with taking the higher dose medication.

## 2017-07-30 NOTE — Telephone Encounter (Signed)
Patient called requesting ambien refill.  She takes 10mg  and it has been working well.  We sent in 5mg  but patient says that does not help and that she has been stable on the 10mg .  Please advise.

## 2017-09-10 ENCOUNTER — Other Ambulatory Visit: Payer: Self-pay | Admitting: *Deleted

## 2017-09-10 DIAGNOSIS — J302 Other seasonal allergic rhinitis: Secondary | ICD-10-CM

## 2017-09-10 MED ORDER — FLUTICASONE PROPIONATE 50 MCG/ACT NA SUSP: 2.0000 | Freq: Every day | NASAL | 11 refills | Status: AC

## 2017-09-14 ENCOUNTER — Ambulatory Visit: Payer: BLUE CROSS/BLUE SHIELD | Attending: Otolaryngology

## 2017-09-14 DIAGNOSIS — G2581 Restless legs syndrome: Secondary | ICD-10-CM | POA: Diagnosis not present

## 2017-09-14 DIAGNOSIS — G4700 Insomnia, unspecified: Secondary | ICD-10-CM | POA: Diagnosis present

## 2017-09-14 DIAGNOSIS — R0683 Snoring: Secondary | ICD-10-CM | POA: Diagnosis not present

## 2017-09-14 DIAGNOSIS — F5101 Primary insomnia: Secondary | ICD-10-CM | POA: Insufficient documentation

## 2017-11-13 ENCOUNTER — Other Ambulatory Visit: Payer: Self-pay | Admitting: Family Medicine

## 2017-11-13 DIAGNOSIS — Z1231 Encounter for screening mammogram for malignant neoplasm of breast: Secondary | ICD-10-CM

## 2017-11-30 ENCOUNTER — Encounter: Payer: Self-pay | Admitting: Obstetrics & Gynecology

## 2017-11-30 ENCOUNTER — Ambulatory Visit (INDEPENDENT_AMBULATORY_CARE_PROVIDER_SITE_OTHER): Payer: BLUE CROSS/BLUE SHIELD | Admitting: Obstetrics & Gynecology

## 2017-11-30 VITALS — BP 133/86 | HR 83 | Wt 152.1 lb

## 2017-11-30 DIAGNOSIS — Z124 Encounter for screening for malignant neoplasm of cervix: Secondary | ICD-10-CM

## 2017-11-30 DIAGNOSIS — Z01419 Encounter for gynecological examination (general) (routine) without abnormal findings: Secondary | ICD-10-CM

## 2017-11-30 DIAGNOSIS — Z1151 Encounter for screening for human papillomavirus (HPV): Secondary | ICD-10-CM | POA: Diagnosis not present

## 2017-11-30 DIAGNOSIS — G4700 Insomnia, unspecified: Secondary | ICD-10-CM

## 2017-11-30 MED ORDER — ZOLPIDEM TARTRATE 10 MG PO TABS: 10.0000 mg | ORAL_TABLET | Freq: Every evening | ORAL | 5 refills | Status: DC | PRN

## 2017-11-30 NOTE — Progress Notes (Signed)
Subjective:    Rebecca Roman is a 51 y.o. married P2 (18 yo son and 39 yo daughter) female who presents for an annual exam. The patient has no complaints today. The patient is sexually active. GYN screening history: last pap: was normal with + HR HPV.  The patient wears seatbelts: yes. The patient participates in regular exercise: yes. Has the patient ever been transfused or tattooed?: no. The patient reports that there is not domestic violence in her life.   Menstrual History: OB History    Gravida Para Term Preterm AB Living   3 2 2  0 0 1   SAB TAB Ectopic Multiple Live Births   0 0 0 0 1      Menarche age:55 No LMP recorded. Patient is not currently having periods (Reason: IUD).    The following portions of the patient's history were reviewed and updated as appropriate: allergies, current medications, past family history, past medical history, past social history, past surgical history and problem list.  Review of Systems Pertinent items are noted in HPI.   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, commissioning) Has had flu vaccine this season Had a sleep study Had a colonoscopy at age 38, knows that she is due now FH- no breast/gyn cancer + colon cancer- paternal GF   Objective:    BP 133/86   Pulse 83   Wt 152 lb 1.6 oz (69 kg)   BMI 23.13 kg/m   General Appearance:    Alert, cooperative, no distress, appears stated age  Head:    Normocephalic, without obvious abnormality, atraumatic  Eyes:    PERRL, conjunctiva/corneas clear, EOM's intact, fundi    benign, both eyes  Ears:    Normal TM's and external ear canals, both ears  Nose:   Nares normal, septum midline, mucosa normal, no drainage    or sinus tenderness  Throat:   Lips, mucosa, and tongue normal; teeth and gums normal  Neck:   Supple, symmetrical, trachea midline, no adenopathy;    thyroid:  no enlargement/tenderness/nodules; no carotid   bruit or  JVD  Back:     Symmetric, no curvature, ROM normal, no CVA tenderness  Lungs:     Clear to auscultation bilaterally, respirations unlabored  Chest Wall:    No tenderness or deformity   Heart:    Regular rate and rhythm, S1 and S2 normal, no murmur, rub   or gallop  Breast Exam:    No tenderness, masses, or nipple abnormality  Abdomen:     Soft, non-tender, bowel sounds active all four quadrants,    no masses, no organomegaly  Genitalia:    Normal female without lesion, discharge or tenderness, NSSA, NT, normal adnexal exam, Liletta strings seen     Extremities:   Extremities normal, atraumatic, no cyanosis or edema  Pulses:   2+ and symmetric all extremities  Skin:   Skin color, texture, turgor normal, no rashes or lesions  Lymph nodes:   Cervical, supraclavicular, and axillary nodes normal  Neurologic:   CNII-XII intact, normal strength, sensation and reflexes    throughout  .    Assessment:    Healthy female exam.    Plan:     Thin prep Pap smear. with cotesting Refill Ambien 10 mg, knows that there is a black box warning Reg [facility] for Shingrex vaccine and GI for colonoscopy Check TSH

## 2017-12-01 LAB — TSH: TSH: 2.92 u[IU]/mL (ref 0.450–4.500)

## 2017-12-07 LAB — CYTOLOGY - PAP
Diagnosis: UNDETERMINED — AB
HPV (WINDOPATH): NOT DETECTED

## 2017-12-10 ENCOUNTER — Other Ambulatory Visit: Payer: Self-pay

## 2017-12-10 ENCOUNTER — Ambulatory Visit
Admission: EM | Admit: 2017-12-10 | Discharge: 2017-12-10 | Disposition: A | Discharge status: Home / Self Care | Payer: BLUE CROSS/BLUE SHIELD | Attending: Emergency Medicine | Admitting: Emergency Medicine

## 2017-12-10 ENCOUNTER — Encounter: Payer: Self-pay | Admitting: Emergency Medicine

## 2017-12-10 DIAGNOSIS — J019 Acute sinusitis, unspecified: Secondary | ICD-10-CM

## 2017-12-10 MED ORDER — FLUCONAZOLE 150 MG PO TABS: 150.0000 mg | ORAL_TABLET | Freq: Once | ORAL | 1 refills | Status: AC

## 2017-12-10 MED ORDER — AMOXICILLIN-POT CLAVULANATE 875-125 MG PO TABS: 1.0000 | ORAL_TABLET | Freq: Two times a day (BID) | ORAL | 0 refills | Status: DC

## 2017-12-10 NOTE — ED Provider Notes (Signed)
HPI  SUBJECTIVE:  Rebecca Roman is a 51 y.o. female who presents with 1 month of nasal congestion, rhinorrhea, postnasal drip.  States that she still has sinus pain and pressure.  Initially had greenish nasal congestion but has now become clear and thick.  States that her upper dental pain has resolved.  States that she feels like she has more chest congestion, and has a cough productive of the same material as her nasal congestion.  She reports a sore throat and neck soreness starting yesterday due to the postnasal drip.  States that this started off is not upper respiratory infection.  She denies fevers, wheezing, shortness of breath.  States that she gets better and then gets worse repeatedly.  She reports occasional ear achiness and muffled hearing that clears when she auto insufflates.  She has tried Zyrtec, Sudafed, Singulair, Mucinex D, Alka-Seltzer plus, Flonase.  Symptoms are worse with lying down.  No alleviating factors.  No antibiotics in the past month.  No antipyretic in the past 6-8 hours.  She has a past medical history of severe allergies, but states that they feel well controlled recently.  Also history of sinusitis.  No history of asthma, emphysema, COPD, smoking, diabetes, hypertension.  PMD: Donnamae Jude, MD ENT: Dr. Kathyrn Sheriff.    Past Medical History:  Diagnosis Date  . Cancer ([place])    basal cell  . Environmental allergies   . GERD (gastroesophageal reflux disease)    heartburn with pregnancy    Past Surgical History:  Procedure Laterality Date  . CESAREAN SECTION  07/27/2011   Procedure: CESAREAN SECTION;  Surgeon: Mora Bellman, MD;  Location: [facility];  Service: Gynecology;  Laterality: N/A;  . NO PAST SURGERIES      Family History  Problem Relation Age of Onset  . Stroke Father   . Breast cancer Neg Hx     Social History   Tobacco Use  . Smoking status: Never Smoker  . Smokeless tobacco: Never Used  Substance Use Topics  . Alcohol use: No  . Drug use: No     No current facility-administered medications for this encounter.   Current Outpatient Medications:  .  fluticasone (FLONASE) 50 MCG/ACT nasal spray, Place 2 sprays into both nostrils daily., Disp: 16 g, Rfl: 11 .  levonorgestrel (MIRENA) 20 MCG/24HR IUD, 1 each by Intrauterine route once., Disp: , Rfl:  .  montelukast (SINGULAIR) 10 MG tablet, Take 1 tablet (10 mg total) by mouth at bedtime., Disp: 90 tablet, Rfl: 3 .  pseudoephedrine (SUDAFED) 30 MG tablet, Take 30 mg by mouth every 6 (six) hours as needed for congestion., Disp: , Rfl:  .  zolpidem (AMBIEN) 10 MG tablet, Take 1 tablet (10 mg total) by mouth at bedtime as needed. for sleep, Disp: 30 tablet, Rfl: 5 .  amoxicillin-clavulanate (AUGMENTIN) 875-125 MG tablet, Take 1 tablet by mouth 2 (two) times daily. X 7 days, Disp: 14 tablet, Rfl: 0 .  fluconazole (DIFLUCAN) 150 MG tablet, Take 1 tablet (150 mg total) by mouth once for 1 dose. 1 tab po x 1. May repeat in 72 hours if no improvement, Disp: 2 tablet, Rfl: 1  Allergies  Allergen Reactions  . Scallops [Shellfish Allergy]     Only scallops, not any other seafood     ROS  As noted in HPI.   Physical Exam  BP 124/79 (BP Location: Left Arm)   Pulse 92   Temp 98.7 F (37.1 C) (Oral)   Resp 14  Ht 5\' 9"  (1.753 m)   Wt 150 lb (68 kg)   SpO2 98%   BMI 22.15 kg/m   Constitutional: Well developed, well nourished, no acute distress Eyes:  EOMI, conjunctiva normal bilaterally HENT: Normocephalic, atraumatic,mucus membranes moist.  Minimal nasal congestion.  Normal turbinates.  No sinus tenderness.  Positive cobblestoning with postnasal drip.  Tonsils normal without exudates.  Uvula midline. Respiratory: Normal inspiratory effort lungs clear bilaterally Cardiovascular: Normal rate GI: nondistended skin: No rash, skin intact Musculoskeletal: no deformities Neurologic: Alert & oriented x 3, no focal neuro deficits Psychiatric: Speech and behavior appropriate   ED  Course   Medications - No data to display  No orders of the defined types were placed in this encounter.   No results found for this or any previous visit (from the past 24 hour(s)). No results found.  ED Clinical Impression  Acute non-recurrent sinusitis, unspecified location   ED Assessment/Plan  Patient with a sinus infection.  Antibiotics indicated due to duration of symptoms.  Doubt strep pharyngitis.  Will send home with Augmentin, she is to continue Flonase and her Mucinex D.  She is to start saline nasal irrigation with a Milta Deiters med sinus rinse.  We will also send home with Diflucan as she states that she gets frequent yeast infections with antibiotics.  Will provide a primary care referral list for routine care.  Meds ordered this encounter  Medications  . amoxicillin-clavulanate (AUGMENTIN) 875-125 MG tablet    Sig: Take 1 tablet by mouth 2 (two) times daily. X 7 days    Dispense:  14 tablet    Refill:  0  . fluconazole (DIFLUCAN) 150 MG tablet    Sig: Take 1 tablet (150 mg total) by mouth once for 1 dose. 1 tab po x 1. May repeat in 72 hours if no improvement    Dispense:  2 tablet    Refill:  1    *This clinic note was created using [employer]. Therefore, there may be occasional mistakes despite careful proofreading.   ?   Melynda Ripple, MD 12/10/17 [phone]

## 2017-12-10 NOTE — ED Triage Notes (Signed)
Patient c/o sinus congestion and pressure and runny nose that started a month ago.  Patient also reports that she is now having some drainage down her thoat causing it to be irritated.

## 2017-12-10 NOTE — Discharge Instructions (Signed)
Take the medication as written. You may take 600 mg of motrin with 1 gram of tylenol up to 3-4 times a day as needed for pain. T Use a neti pot or the NeilMed sinus rinse as often as you want to to reduce nasal congestion. Follow the directions on the box.  Continue the Mucinex D and do not take the Zyrtec or any other antihistamine while you are taking the Mucinex.  Here is a list of primary care providers who are taking new patients:  Dr. Otilio Miu, Dr. Adline Potter [address] [place] [address] [place] [address]  [phone]  [facility] [address] [place], [place] [address] [phone]  [facility] [place]  [phone] [place], [place] [address]  Here are clinics/ other resources who will see you if you do not have insurance. Some have certain criteria that you must meet. Call them and find out what they are:  [facility]: [address], [place], [place] [address] Phone: [phone] Hours: First and Third Saturdays of each Month, 9 a.m. - 1 p.m.  [facility]: [address], [place], [place], [place] [address] Phone: [phone] Hours: Tuesday, 4 p.m. - 8 p.m. Thursday, 1 p.m. - 8 p.m. Wednesday, 9 a.m. - [facility] [address] [place], [place] [address] Phone: [phone] Pharmacy Phone Number: [phone] Dental Phone Number: [phone] [place] Help: [phone]  Dental Hours: Monday - Thursday, 8 a.m. - 6 p.m.  [facility] [address] [place], [place] [address] Phone: [phone] Pharmacy Phone Number: [phone] [facility] Insurance Help: [phone]  [facility] [address] [place], [place] [address] Phone: [phone] Pharmacy Phone Number: [phone] [facility] Insurance Help: [phone]  [facility] [address] [place], [place] [address] Phone: [phone] [facility] Insurance Help: [phone]   [address] [place], [place] [address] Phone: [phone]  Go to www.goodrx.com to look up your medications. This will give you a list of where you can find your prescriptions at the most affordable prices. Or ask the pharmacist what the cash price is, or if they have any other discount programs available to help make your medication more affordable. This can be less expensive than what you would pay with insurance.

## 2017-12-12 ENCOUNTER — Other Ambulatory Visit: Payer: Self-pay | Admitting: *Deleted

## 2017-12-12 ENCOUNTER — Telehealth: Payer: Self-pay | Admitting: Emergency Medicine

## 2017-12-12 DIAGNOSIS — G4700 Insomnia, unspecified: Secondary | ICD-10-CM

## 2017-12-12 MED ORDER — ZOLPIDEM TARTRATE 10 MG PO TABS: 10.0000 mg | ORAL_TABLET | Freq: Every evening | ORAL | 5 refills | Status: DC | PRN

## 2017-12-12 MED ORDER — DOXYCYCLINE HYCLATE 100 MG PO CAPS: 100.0000 mg | ORAL_CAPSULE | Freq: Two times a day (BID) | ORAL | 0 refills | Status: DC

## 2017-12-12 NOTE — Telephone Encounter (Signed)
Stop Augmentin. Start Doxy.

## 2017-12-12 NOTE — Telephone Encounter (Signed)
Patient notified of a change in abx per Dr. Lacinda Axon.

## 2017-12-12 NOTE — Telephone Encounter (Signed)
Patient called today c/o vomiting today. Patient thinks the antibiotic (Augmentin) is making her sick. Patient was seen and treated for sinusitis 12/10/17. Patient is also taking Mucinex D, which she has taken before without any problems. Patient has had 1 dose of Augmentin today and 3 episodes of emesis. Patient is taking abx with food. Patient states she felt "funny" yesterday, but no emesis.

## 2017-12-18 DIAGNOSIS — J309 Allergic rhinitis, unspecified: Secondary | ICD-10-CM | POA: Insufficient documentation

## 2017-12-18 DIAGNOSIS — R87610 Atypical squamous cells of undetermined significance on cytologic smear of cervix (ASC-US): Secondary | ICD-10-CM | POA: Insufficient documentation

## 2017-12-18 DIAGNOSIS — R87810 Cervical high risk human papillomavirus (HPV) DNA test positive: Secondary | ICD-10-CM

## 2017-12-25 ENCOUNTER — Ambulatory Visit
Admission: RE | Admit: 2017-12-25 | Discharge: 2017-12-25 | Disposition: A | Discharge status: Home / Self Care | Payer: BLUE CROSS/BLUE SHIELD | Source: Ambulatory Visit | Attending: Family Medicine | Admitting: Family Medicine

## 2017-12-25 DIAGNOSIS — Z1231 Encounter for screening mammogram for malignant neoplasm of breast: Secondary | ICD-10-CM | POA: Diagnosis present

## 2018-02-12 ENCOUNTER — Encounter: Payer: Self-pay | Admitting: *Deleted

## 2018-02-12 ENCOUNTER — Ambulatory Visit
Admission: EM | Admit: 2018-02-12 | Discharge: 2018-02-12 | Disposition: A | Discharge status: Home / Self Care | Payer: BLUE CROSS/BLUE SHIELD | Attending: Family Medicine | Admitting: Family Medicine

## 2018-02-12 DIAGNOSIS — R05 Cough: Secondary | ICD-10-CM | POA: Diagnosis not present

## 2018-02-12 DIAGNOSIS — J111 Influenza due to unidentified influenza virus with other respiratory manifestations: Secondary | ICD-10-CM

## 2018-02-12 DIAGNOSIS — R509 Fever, unspecified: Secondary | ICD-10-CM | POA: Diagnosis not present

## 2018-02-12 DIAGNOSIS — R69 Illness, unspecified: Secondary | ICD-10-CM

## 2018-02-12 DIAGNOSIS — R0981 Nasal congestion: Secondary | ICD-10-CM

## 2018-02-12 LAB — RAPID STREP SCREEN (MED CTR MEBANE ONLY): Streptococcus, Group A Screen (Direct): NEGATIVE

## 2018-02-12 MED ORDER — OSELTAMIVIR PHOSPHATE 75 MG PO CAPS: 75.0000 mg | ORAL_CAPSULE | Freq: Two times a day (BID) | ORAL | 0 refills | Status: DC

## 2018-02-12 MED ORDER — BENZONATATE 100 MG PO CAPS: 100.0000 mg | ORAL_CAPSULE | Freq: Three times a day (TID) | ORAL | 0 refills | Status: DC | PRN

## 2018-02-12 MED ORDER — HYDROCOD POLST-CPM POLST ER 10-8 MG/5ML PO SUER: 5.0000 mL | Freq: Every evening | ORAL | 0 refills | Status: DC | PRN

## 2018-02-12 NOTE — Discharge Instructions (Signed)
Take medication as prescribed. Rest. Drink plenty of fluids.  ° °Follow up with your primary care physician this week as needed. Return to Urgent care for new or worsening concerns.  ° °

## 2018-02-12 NOTE — ED Triage Notes (Signed)
C/O productive cough, chills and temp. Started having these symptoms on Saturday. Feeling worse this morning.

## 2018-02-12 NOTE — ED Provider Notes (Signed)
[facility] ____________________________________________  Time seen: Approximately 2:52 PM  I have reviewed the triage vital signs and the nursing notes.   HISTORY  Chief Complaint Influenza  HPI Rebecca Roman is a 52 y.o. female presented for evaluation of runny nose, nasal congestion, cough, chills and sensation of fever present since Saturday.  Reports symptoms started Saturday night and have continued.  Denies home sick contacts, reports positive work sick contacts.  Reports cough disrupting sleep and sounds like a deep wet cough per patient.  Continues to drink fluids well, decreased appetite.  States feels achy all over.  Some scratchy sore throat, current sore throat is improved from initial.Unrelieved with over-the-counter cough and congestion medications.  Denies other aggravating or alleviating factors.  Denies chest pain, shortness of breath, abdominal pain, dysuria, extremity pain, extremity swelling or rash. Denies recent sickness. Denies recent antibiotic use.   Rebecca Peach, MD: PCP No LMP recorded. Patient is not currently having periods (Reason: IUD).   Past Medical History:  Diagnosis Date  . Cancer ([place])    basal cell  . Environmental allergies   . GERD (gastroesophageal reflux disease)    heartburn with pregnancy    Patient Active Problem List   Diagnosis Date Noted  . Hot flashes 07/09/2014  . Endometrial polyp 09/27/2013  . Excessive or frequent menstruation 08/15/2013  . Insomnia 08/15/2013    Past Surgical History:  Procedure Laterality Date  . CESAREAN SECTION  07/27/2011   Procedure: CESAREAN SECTION;  Surgeon: Mora Bellman, MD;  Location: [facility];  Service: Gynecology;  Laterality: N/A;  . NO PAST SURGERIES       No current facility-administered medications for this encounter.   Current Outpatient Medications:  .  cetirizine (ZYRTEC) 10 MG tablet, Take 10 mg by mouth daily., Disp: , Rfl:  .  fluticasone (FLONASE) 50 MCG/ACT  nasal spray, Place 2 sprays into both nostrils daily., Disp: 16 g, Rfl: 11 .  levonorgestrel (MIRENA) 20 MCG/24HR IUD, 1 each by Intrauterine route once., Disp: , Rfl:  .  montelukast (SINGULAIR) 10 MG tablet, Take 1 tablet (10 mg total) by mouth at bedtime., Disp: 90 tablet, Rfl: 3 .  pseudoephedrine (SUDAFED) 30 MG tablet, Take 30 mg by mouth every 6 (six) hours as needed for congestion., Disp: , Rfl:  .  zolpidem (AMBIEN) 10 MG tablet, Take 1 tablet (10 mg total) by mouth at bedtime as needed. for sleep, Disp: 30 tablet, Rfl: 5 .  benzonatate (TESSALON PERLES) 100 MG capsule, Take 1 capsule (100 mg total) by mouth 3 (three) times daily as needed for cough., Disp: 15 capsule, Rfl: 0 .  chlorpheniramine-HYDROcodone (TUSSIONEX PENNKINETIC ER) 10-8 MG/5ML SUER, Take 5 mLs by mouth at bedtime as needed for cough. do not drive or operate machinery while taking as can cause drowsiness., Disp: 60 mL, Rfl: 0 .  oseltamivir (TAMIFLU) 75 MG capsule, Take 1 capsule (75 mg total) by mouth every 12 (twelve) hours., Disp: 10 capsule, Rfl: 0  Allergies Scallops [shellfish allergy]  Family History  Problem Relation Age of Onset  . Stroke Father   . Breast cancer Neg Hx     Social History Social History   Tobacco Use  . Smoking status: Never Smoker  . Smokeless tobacco: Never Used  Substance Use Topics  . Alcohol use: Yes    Comment: ocssonaly  . Drug use: No    Review of Systems Constitutional: AS above.   Cardiovascular: Denies chest pain. Respiratory: Denies shortness of breath.  Gastrointestinal: No abdominal pain. no vomiting.  No diarrhea.  No constipation. Genitourinary: Negative for dysuria. Musculoskeletal: Negative for back pain. Skin: Negative for rash.   ____________________________________________   PHYSICAL EXAM:  VITAL SIGNS: ED Triage Vitals  Enc Vitals Group     BP 02/12/18 1407 131/73     Pulse -- 110     Resp 02/12/18 1407 16     Temp 02/12/18 1407 99.4 F (37.4  C)     Temp Source 02/12/18 1407 Oral     SpO2 02/12/18 1407 99 %     Weight 02/12/18 1404 150 lb (68 kg)     Height 02/12/18 1404 5\' 9"  (1.753 m)     Head Circumference --      Peak Flow --      Pain Score 02/12/18 1404 2     Pain Loc --      Pain Edu? --      Excl. in [place]? --    Constitutional: Alert and oriented. Well appearing and in no acute distress. Eyes: Conjunctivae are normal.  Head: Atraumatic. No sinus tenderness to palpation. No swelling. No erythema.  Ears: no erythema, normal TMs bilaterally.   Nose:Nasal congestion with clear rhinorrhea  Mouth/Throat: Mucous membranes are moist. Mild pharyngeal erythema. No tonsillar swelling or exudate.  Neck: No stridor.  No cervical spine tenderness to palpation. Hematological/Lymphatic/Immunilogical: No cervical lymphadenopathy. Cardiovascular: Normal rate, regular rhythm. Grossly normal heart sounds.  Good peripheral circulation. Respiratory: Normal respiratory effort.  No retractions. No wheezes, rales or rhonchi. Good air movement.  Musculoskeletal: Ambulatory with steady gait. No cervical, thoracic or lumbar tenderness to palpation. Neurologic:  Normal speech and language. No gait instability. Skin:  Skin appears warm, dry and intact. No rash noted. Psychiatric: Mood and affect are normal. Speech and behavior are normal.  ___________________________________________   LABS (all labs ordered are listed, but only abnormal results are displayed)  Labs Reviewed  RAPID STREP SCREEN (NOT AT [facility])  CULTURE, GROUP A STREP [facility])   PROCEDURES Procedures    INITIAL IMPRESSION / ASSESSMENT AND PLAN / ED COURSE  Pertinent labs & imaging results that were available during my care of the patient were reviewed by me and considered in my medical decision making (see chart for details).  Overall well appearing patient, no acute distress. Suspect influenza like illness, will treat with tamiflu, tessalon perles and prn Tussionex.  Encouraged rest, fluids and supportive care. Discussed indication, risks and benefits of medications with patient.  Discussed follow up with Primary care physician this week. Discussed follow up and return parameters including no resolution or any worsening concerns. Patient verbalized understanding and agreed to plan.   ____________________________________________   FINAL CLINICAL IMPRESSION(S) / ED DIAGNOSES  Final diagnoses:  Influenza-like illness     ED Discharge Orders        Ordered    oseltamivir (TAMIFLU) 75 MG capsule  Every 12 hours     02/12/18 1455    chlorpheniramine-HYDROcodone (TUSSIONEX PENNKINETIC ER) 10-8 MG/5ML SUER  At bedtime PRN     02/12/18 1455    benzonatate (TESSALON PERLES) 100 MG capsule  3 times daily PRN     02/12/18 1455       Note: This dictation was prepared with Dragon dictation along with smaller phrase technology. Any transcriptional errors that result from this process are unintentional.         Marylene Land, NP 02/12/18 1756

## 2018-02-15 LAB — CULTURE, GROUP A STREP (THRC)

## 2018-05-29 NOTE — Discharge Instructions (Signed)
[facility] °[facility] °ENDOSCOPIC SINUS SURGERY °[facility] ° °What is Functional Endoscopic Sinus Surgery? ° The Surgery involves making the natural openings of the sinuses larger by removing the bony partitions that separate the sinuses from the nasal cavity.  The natural sinus lining is preserved as much as possible to allow the sinuses to resume normal function after the surgery.  In some patients nasal polyps (excessively swollen lining of the sinuses) may be removed to relieve obstruction of the sinus openings.  The surgery is performed through the nose using lighted scopes, which eliminates the need for incisions on the face.  A septoplasty is a different procedure which is sometimes performed with sinus surgery.  It involves straightening the boy partition that separates the two sides of your nose.  A crooked or deviated septum may need repair if is obstructing the sinuses or nasal airflow.  Turbinate reduction is also often performed during sinus surgery.  The turbinates are bony proturberances from the side walls of the nose which swell and can obstruct the nose in patients with sinus and allergy problems.  Their size can be surgically reduced to help relieve nasal obstruction. ° °What Can Sinus Surgery Do For Me? ° Sinus surgery can reduce the frequency of sinus infections requiring antibiotic treatment.  This can provide improvement in nasal congestion, post-nasal drainage, facial pressure and nasal obstruction.  Surgery will NOT prevent you from ever having an infection again, so it usually only for patients who get infections 4 or more times yearly requiring antibiotics, or for infections that do not clear with antibiotics.  It will not cure nasal allergies, so patients with allergies may still require medication to treat their allergies after surgery. Surgery may improve headaches related to sinusitis, however, some people will continue to  require medication to control sinus headaches related to allergies.  Surgery will do nothing for other forms of headache (migraine, tension or cluster). ° °What Are the Risks of Endoscopic Sinus Surgery? ° Current techniques allow surgery to be performed safely with little risk, however, there are rare complications that patients should be aware of.  Because the sinuses are located around the eyes, there is risk of eye injury, including blindness, though again, this would be quite rare. This is usually a result of bleeding behind the eye during surgery, which puts the vision oat risk, though there are treatments to protect the vision and prevent permanent disrupted by surgery causing a leak of the spinal fluid that surrounds the brain.  More serious complications would include bleeding inside the brain cavity or damage to the brain.  Again, all of these complications are uncommon, and spinal fluid leaks can be safely managed surgically if they occur.  The most common complication of sinus surgery is bleeding from the nose, which may require packing or cauterization of the nose.  Continued sinus have polyps may experience recurrence of the polyps requiring revision surgery.  Alterations of sense of smell or injury to the tear ducts are also rare complications.  ° °What is the Surgery Like, and what is the Recovery? ° The Surgery usually takes a couple of hours to perform, and is usually performed under a general anesthetic (completely asleep).  Patients are usually discharged home after a couple of hours.  Sometimes during surgery it is necessary to pack the nose to control bleeding, and the packing is left in place for 24 - 48 hours, and removed by your surgeon.    If a septoplasty was performed during the procedure, there is often a splint placed which must be removed after 5-7 days.   °Discomfort: Pain is usually mild to moderate, and can be controlled by prescription pain medication or acetaminophen (Tylenol).   Aspirin, Ibuprofen (Advil, Motrin), or Naprosyn (Aleve) should be avoided, as they can cause increased bleeding.  Most patients feel sinus pressure like they have a bad head cold for several days.  Sleeping with your head elevated can help reduce swelling and facial pressure, as can ice packs over the face.  A humidifier may be helpful to keep the mucous and blood from drying in the nose.  ° °Diet: There are no specific diet restrictions, however, you should generally start with clear liquids and a light diet of bland foods because the anesthetic can cause some nausea.  Advance your diet depending on how your stomach feels.  Taking your pain medication with food will often help reduce stomach upset which pain medications can cause. ° °Nasal Saline Irrigation: It is important to remove blood clots and dried mucous from the nose as it is healing.  This is done by having you irrigate the nose at least 3 - 4 times daily with a salt water solution.  We recommend using NeilMed Sinus Rinse (available at the drug store).  Fill the squeeze bottle with the solution, bend over a sink, and insert the tip of the squeeze bottle into the nose ½ of an inch.  Point the tip of the squeeze bottle towards the inside corner of the eye on the same side your irrigating.  Squeeze the bottle and gently irrigate the nose.  If you bend forward as you do this, most of the fluid will flow back out of the nose, instead of down your throat.   The solution should be warm, near body temperature, when you irrigate.   Each time you irrigate, you should use a full squeeze bottle.  ° °Note that if you are instructed to use Nasal Steroid Sprays at any time after your surgery, irrigate with saline BEFORE using the steroid spray, so you do not wash it all out of the nose. °Another product, Nasal Saline Gel (such as AYR Nasal Saline Gel) can be applied in each nostril 3 - 4 times daily to moisture the nose and reduce scabbing or crusting. ° °Bleeding:   Bloody drainage from the nose can be expected for several days, and patients are instructed to irrigate their nose frequently with salt water to help remove mucous and blood clots.  The drainage may be dark red or brown, though some fresh blood may be seen intermittently, especially after irrigation.  Do not blow you nose, as bleeding may occur. If you must sneeze, keep your mouth open to allow air to escape through your mouth. ° °If heavy bleeding occurs: Irrigate the nose with saline to rinse out clots, then spray the nose 3 - 4 times with Afrin Nasal Decongestant Spray.  The spray will constrict the blood vessels to slow bleeding.  Pinch the lower half of your nose shut to apply pressure, and lay down with your head elevated.  Ice packs over the nose may help as well. If bleeding persists despite these measures, you should notify your doctor.  Do not use the Afrin routinely to control nasal congestion after surgery, as it can result in worsening congestion and may affect healing.  ° ° ° °Activity: Return to work varies among patients. Most patients will be   out of work at least 5 - 7 days to recover.  Patient may return to work after they are off of narcotic pain medication, and feeling well enough to perform the functions of their job.  Patients must avoid heavy lifting (over 10 pounds) or strenuous physical for 2 weeks after surgery, so your employer may need to assign you to light duty, or keep you out of work longer if light duty is not possible.  NOTE: you should not drive, operate dangerous machinery, do any mentally demanding tasks or make any important legal or financial decisions while on narcotic pain medication and recovering from the general anesthetic.  °  °Call Your Doctor Immediately if You Have Any of the Following: °1. Bleeding that you cannot control with the above measures °2. Loss of vision, double vision, bulging of the eye or black eyes. °3. Fever over 101 degrees °4. Neck stiffness with  severe headache, fever, nausea and change in mental state. °You are always encourage to call anytime with concerns, however, please call with requests for pain medication refills during office hours. ° °Office Endoscopy: During follow-up visits your doctor will remove any packing or splints that may have been placed and evaluate and clean your sinuses endoscopically.  Topical anesthetic will be used to make this as comfortable as possible, though you may want to take your pain medication prior to the visit.  How often this will need to be done varies from patient to patient.  After complete recovery from the surgery, you may need follow-up endoscopy from time to time, particularly if there is concern of recurrent infection or nasal polyps. ° ° °General Anesthesia, Adult, Care After °These instructions provide you with information about caring for yourself after your procedure. Your health care provider may also give you more specific instructions. Your treatment has been planned according to current medical practices, but problems sometimes occur. Call your health care provider if you have any problems or questions after your procedure. °What can I expect after the procedure? °After the procedure, it is common to have: °· Vomiting. °· A sore throat. °· Mental slowness. ° °It is common to feel: °· Nauseous. °· Cold or shivery. °· Sleepy. °· Tired. °· Sore or achy, even in parts of your body where you did not have surgery. ° °Follow these instructions at home: °For at least 24 hours after the procedure: °· Do not: °? Participate in activities where you could fall or become injured. °? Drive. °? Use heavy machinery. °? Drink alcohol. °? Take sleeping pills or medicines that cause drowsiness. °? Make important decisions or sign legal documents. °? Take care of children on your own. °· Rest. °Eating and drinking °· If you vomit, drink water, juice, or soup when you can drink without vomiting. °· Drink enough fluid to  keep your urine clear or pale yellow. °· Make sure you have little or no nausea before eating solid foods. °· Follow the diet recommended by your health care provider. °General instructions °· Have a responsible adult stay with you until you are awake and alert. °· Return to your normal activities as told by your health care provider. Ask your health care provider what activities are safe for you. °· Take over-the-counter and prescription medicines only as told by your health care provider. °· If you smoke, do not smoke without supervision. °· Keep all follow-up visits as told by your health care provider. This is important. °Contact a health care provider if: °· You   continue to have nausea or vomiting at home, and medicines are not helpful. °· You cannot drink fluids or start eating again. °· You cannot urinate after 8-12 hours. °· You develop a skin rash. °· You have fever. °· You have increasing redness at the site of your procedure. °Get help right away if: °· You have difficulty breathing. °· You have chest pain. °· You have unexpected bleeding. °· You feel that you are having a life-threatening or urgent problem. °This information is not intended to replace advice given to you by your health care provider. Make sure you discuss any questions you have with your health care provider. °Document Released: 03/05/2001 Document Revised: 05/01/2016 Document Reviewed: 11/11/2015 °Elsevier Interactive Patient Education © 2018 [employer] ° °

## 2018-05-30 ENCOUNTER — Encounter: Payer: Self-pay | Admitting: *Deleted

## 2018-05-30 ENCOUNTER — Other Ambulatory Visit: Payer: Self-pay

## 2018-06-05 ENCOUNTER — Encounter
Admission: RE | Disposition: A | Discharge status: Home / Self Care | Payer: Self-pay | Source: Ambulatory Visit | Attending: Otolaryngology

## 2018-06-05 ENCOUNTER — Ambulatory Visit
Admission: RE | Admit: 2018-06-05 | Discharge: 2018-06-05 | Disposition: A | Discharge status: Home / Self Care | Payer: BLUE CROSS/BLUE SHIELD | Source: Ambulatory Visit | Attending: Otolaryngology | Admitting: Otolaryngology

## 2018-06-05 ENCOUNTER — Ambulatory Visit: Payer: BLUE CROSS/BLUE SHIELD | Admitting: Anesthesiology

## 2018-06-05 DIAGNOSIS — Z823 Family history of stroke: Secondary | ICD-10-CM | POA: Diagnosis not present

## 2018-06-05 DIAGNOSIS — J3489 Other specified disorders of nose and nasal sinuses: Secondary | ICD-10-CM | POA: Insufficient documentation

## 2018-06-05 DIAGNOSIS — Z79899 Other long term (current) drug therapy: Secondary | ICD-10-CM | POA: Diagnosis not present

## 2018-06-05 DIAGNOSIS — J342 Deviated nasal septum: Secondary | ICD-10-CM | POA: Diagnosis present

## 2018-06-05 DIAGNOSIS — M950 Acquired deformity of nose: Secondary | ICD-10-CM | POA: Diagnosis not present

## 2018-06-05 DIAGNOSIS — J320 Chronic maxillary sinusitis: Secondary | ICD-10-CM | POA: Insufficient documentation

## 2018-06-05 DIAGNOSIS — J343 Hypertrophy of nasal turbinates: Secondary | ICD-10-CM | POA: Diagnosis not present

## 2018-06-05 HISTORY — PX: SEPTOPLASTY: SHX2393

## 2018-06-05 HISTORY — DX: Presence of spectacles and contact lenses: Z97.3

## 2018-06-05 HISTORY — PX: IMAGE GUIDED SINUS SURGERY: SHX6570

## 2018-06-05 HISTORY — PX: MAXILLARY ANTROSTOMY: SHX2003

## 2018-06-05 HISTORY — DX: Motion sickness, initial encounter: T75.3XXA

## 2018-06-05 HISTORY — PX: TURBINATE REDUCTION: SHX6157

## 2018-06-05 SURGERY — SINUS SURGERY, WITH IMAGING GUIDANCE
Anesthesia: General | Site: Nose | Wound class: "Clean Contaminated "

## 2018-06-05 MED ORDER — PROPOFOL 10 MG/ML IV BOLUS
INTRAVENOUS | Status: DC | PRN
  Administered 2018-06-05: 120 mg via INTRAVENOUS

## 2018-06-05 MED ORDER — FENTANYL CITRATE (PF) 100 MCG/2ML IJ SOLN: 25.0000 ug | INTRAMUSCULAR | Status: DC | PRN

## 2018-06-05 MED ORDER — OXYCODONE HCL 5 MG/5ML PO SOLN: 5.0000 mg | Freq: Once | ORAL | Status: AC | PRN

## 2018-06-05 MED ORDER — PROMETHAZINE HCL 25 MG/ML IJ SOLN: 6.2500 mg | INTRAMUSCULAR | Status: DC | PRN

## 2018-06-05 MED ORDER — GLYCOPYRROLATE 0.2 MG/ML IJ SOLN
INTRAMUSCULAR | Status: DC | PRN
  Administered 2018-06-05: .1 mg via INTRAVENOUS

## 2018-06-05 MED ORDER — OXYCODONE HCL 5 MG PO TABS
5.0000 mg | ORAL_TABLET | Freq: Once | ORAL | Status: AC | PRN
  Administered 2018-06-05: 5 mg via ORAL

## 2018-06-05 MED ORDER — LIDOCAINE HCL (CARDIAC) PF 100 MG/5ML IV SOSY
PREFILLED_SYRINGE | INTRAVENOUS | Status: DC | PRN
  Administered 2018-06-05: 40 mg via INTRAVENOUS

## 2018-06-05 MED ORDER — ONDANSETRON HCL 4 MG/2ML IJ SOLN
INTRAMUSCULAR | Status: DC | PRN
  Administered 2018-06-05: 4 mg via INTRAVENOUS

## 2018-06-05 MED ORDER — LIDOCAINE-EPINEPHRINE 1 %-1:100000 IJ SOLN
INTRAMUSCULAR | Status: DC | PRN
  Administered 2018-06-05: 6 mL
  Administered 2018-06-05: 4.5 mL

## 2018-06-05 MED ORDER — FENTANYL CITRATE (PF) 100 MCG/2ML IJ SOLN
INTRAMUSCULAR | Status: DC | PRN
  Administered 2018-06-05: 50 ug via INTRAVENOUS

## 2018-06-05 MED ORDER — DEXAMETHASONE SODIUM PHOSPHATE 4 MG/ML IJ SOLN
INTRAMUSCULAR | Status: DC | PRN
  Administered 2018-06-05: 10 mg via INTRAVENOUS

## 2018-06-05 MED ORDER — LACTATED RINGERS IV SOLN
INTRAVENOUS | Status: DC | PRN
  Administered 2018-06-05 (×2): via INTRAVENOUS

## 2018-06-05 MED ORDER — PHENYLEPHRINE HCL 0.5 % NA SOLN
NASAL | Status: DC | PRN
  Administered 2018-06-05: 30 mL via TOPICAL

## 2018-06-05 MED ORDER — LACTATED RINGERS IV SOLN
10.0000 mL/h | INTRAVENOUS | Status: DC
  Administered 2018-06-05 (×2): 10 mL/h via INTRAVENOUS

## 2018-06-05 MED ORDER — MIDAZOLAM HCL 5 MG/5ML IJ SOLN
INTRAMUSCULAR | Status: DC | PRN
  Administered 2018-06-05: 2 mg via INTRAVENOUS

## 2018-06-05 MED ORDER — ACETAMINOPHEN 10 MG/ML IV SOLN
1000.0000 mg | Freq: Once | INTRAVENOUS | Status: AC
  Administered 2018-06-05: 1000 mg via INTRAVENOUS

## 2018-06-05 MED ORDER — CEFAZOLIN SODIUM-DEXTROSE 2-4 GM/100ML-% IV SOLN
2.0000 g | Freq: Once | INTRAVENOUS | Status: AC
  Administered 2018-06-05: 2 g via INTRAVENOUS

## 2018-06-05 MED ORDER — OXYMETAZOLINE HCL 0.05 % NA SOLN
2.0000 | Freq: Once | NASAL | Status: AC
  Administered 2018-06-05: 2 via NASAL

## 2018-06-05 MED ORDER — ROCURONIUM BROMIDE 100 MG/10ML IV SOLN
INTRAVENOUS | Status: DC | PRN
  Administered 2018-06-05: 20 mg via INTRAVENOUS

## 2018-06-05 SURGICAL SUPPLY — 36 items
BATTERY INSTRU NAVIGATION (MISCELLANEOUS) ×12 IMPLANT
CANISTER SUCT 1200ML W/VALVE (MISCELLANEOUS) ×4 IMPLANT
CATH IV 18X1 1/4 SAFELET (CATHETERS) ×4 IMPLANT
COAGULATOR SUCT 8FR VV (MISCELLANEOUS) ×4 IMPLANT
DRAPE HEAD BAR (DRAPES) ×4 IMPLANT
ELECT REM PT RETURN 9FT ADLT (ELECTROSURGICAL) ×4
ELECTRODE REM PT RTRN 9FT ADLT (ELECTROSURGICAL) ×2 IMPLANT
GLOVE PI ULTRA LF STRL 7.5 (GLOVE) ×4 IMPLANT
GLOVE PI ULTRA NON LATEX 7.5 (GLOVE) ×6
IMPLANT NASAL LATERA ABSORABLE (Miscellaneous) ×2 IMPLANT
IV CATH 18X1 1/4 SAFELET (CATHETERS) ×2
IV NS 500ML (IV SOLUTION) ×2
IV NS 500ML BAXH (IV SOLUTION) ×2 IMPLANT
KIT TURNOVER KIT A (KITS) ×4 IMPLANT
NDL ANESTHESIA 27G X 3.5 (NEEDLE) ×2 IMPLANT
NEEDLE ANESTHESIA  27G X 3.5 (NEEDLE) ×2
NEEDLE ANESTHESIA 27G X 3.5 (NEEDLE) ×2 IMPLANT
NS IRRIG 500ML POUR BTL (IV SOLUTION) ×4 IMPLANT
PACK DRAPE NASAL/ENT (PACKS) ×4 IMPLANT
PACKING NASAL EPIS 4X2.4 XEROG (MISCELLANEOUS) ×8 IMPLANT
PATTIES SURGICAL .5 X3 (DISPOSABLE) ×4 IMPLANT
SHAVER DIEGO BLD STD TYPE A (BLADE) ×2 IMPLANT
SOL ANTI-FOG 6CC FOG-OUT (MISCELLANEOUS) ×2 IMPLANT
SOL FOG-OUT ANTI-FOG 6CC (MISCELLANEOUS) ×2
SPLINT NASAL SEPTAL BLV .50 ST (MISCELLANEOUS) ×4 IMPLANT
STRAP BODY AND KNEE 60X3 (MISCELLANEOUS) ×4 IMPLANT
SUT CHROMIC 3-0 (SUTURE) ×2
SUT CHROMIC 3-0 KS 27XMFL CR (SUTURE) ×2
SUT ETHILON 3-0 KS 30 BLK (SUTURE) ×4 IMPLANT
SUT PLAIN GUT 4-0 (SUTURE) ×4 IMPLANT
SUTURE CHRMC 3-0 KS 27XMFL CR (SUTURE) IMPLANT
SYR 3ML LL SCALE MARK (SYRINGE) ×4 IMPLANT
TOWEL OR 17X26 4PK STRL BLUE (TOWEL DISPOSABLE) ×4 IMPLANT
TRACKER CRANIALMASK (MASK) ×4 IMPLANT
TUBING DECLOG MULTIDEBRIDER (TUBING) ×4 IMPLANT
WATER STERILE IRR 250ML POUR (IV SOLUTION) ×4 IMPLANT

## 2018-06-05 NOTE — Op Note (Signed)
06/05/2018  11:53 AM  [id number]   Pre-Op Dx:  Deviated Nasal Septum, chronic bilateral maxillary sinusitis, hypertrophic Inferior Turbinates, hypertrophied right middle turbinate, nasal valve collapse and stenosis  Post-op Dx: Same  Proc: Bilateral nasal valve repair with implant Ladean Raya), nasal septoplasty, bilateral endoscopic maxillary antrostomies with removal of tissue on the left side, bilateral Partial Reduction Inferior Turbinates, partial endoscopic reduction of right middle turbinate, use of image guided system  Surg:  Rebecca Alas Azadeh Hyder  Anes:  GOT  EBL: 100 mL  Comp: None  Findings: Septum deviated to the left with obstruction of the left nasal airway mostly posteriorly.  Enlarged right middle and inferior turbinates and left inferior turbinates.  Polyps and thickened mucous membranes in the base and posterior wall of the left maxillary sinus that were removed.  Narrowed nasal valves on both sides with collapse of the lateral nasal walls with inspiration  Procedure: With the patient in a comfortable supine position,  general orotracheal anesthesia was induced without difficulty.     The patient received preoperative Afrin spray for topical decongestion and vasoconstriction.  Intravenous prophylactic antibiotics were administered.  The image guided system was brought in and the CT scan was downloaded from the disc.  The template was applied to the face and was registered in the system with 0.5 mm of variance.  The sections meds were then registered the system and there was good alignment with both sides.  At an appropriate level, the patient was placed in a semi-sitting position.  Nasal vibrissae were trimmed.   1% Xylocaine with 1:100,000 epinephrine, 10 cc's, was infiltrated into the anterior floor of the nose, into the nasal spine region, into the membranous columella, and finally into the submucoperichondrial plane of the septum on both sides.  Several minutes were allowed for  this to take effect.  Cottoniod pledgetts soaked in Afrin and 4% Xylocaine were placed into both nasal cavities and left while the patient was prepped and draped in the standard fashion.  The materials were removed from the nose and observed to be intact and correct in number.  The nose was inspected with a headlight and the 0 degrees scope with the findings as described above.  A left Killian incision was sharply executed and carried down to the quadrangular cartilage. The mucoperichondrium was elelvated along the quadrangular plate back to the bony-cartilaginous junction. The mucoperiostium was then elevated along the ethmoid plate and the vomer. The boney-catilaginous junction was then split with a freer elevator and the mucoperiosteum was elevated on the opposite side. The mucoperiosteum was then elevated along the maxillary crest as needed to expose the crooked bone of the crest.  Boney spurs of the vomer and maxillary crest were removed with Donavan Foil forceps.  The cartilaginous plate was trimmed along its posterior and inferior borders of about 2 mm of cartilage to free it up inferiorly. Some of the deviated ethmoid plate was then fractured and removed with Takahashi forceps to free up the posterior border of the quadrangular plate and allow it to swing back to the midline. The mucosal flaps were placed back into their anatomic position to allow visualization of the airways. The septum now sat in the midline with an improved airway.  A 3-0 Chromic suture on a Keith needle in used to anchor the inferior septum at the nasal spine with a through and through suture. The mucosal flaps are then sutured together using a through and through whip stitch of 4-0 Plain Gut with a mini-Keith  needle.  This was used to close the [place] incision as well.   The inferior turbinates were then inspected. An incision was created along the inferior aspect of the left inferior turbinate with removal of some of the  inferior soft tissue and bone. Electrocautery was used to control bleeding in the area. The remaining turbinate was then outfractured to open up the airway further. There was no significant bleeding noted. The right turbinate was then trimmed and outfractured in a similar fashion.  The 0 degrees scope was used to visualize the right middle turbinate which was extremely wide.  It was more of an L-shaped.  The bottom inferior portion of the turbinate was trimmed off with Greenwald forceps and electrocautery was used at the edge.  The medial portion was outfractured slightly to allow the septum to come back towards the middle.  The 0 and 30 degrees scopes were then used to visualize the middle meatus.  The middle turbinate was infractured to visualize the middle meatus.  The uncinate process was incised using a side biter and was removed with 45 degree through biting forceps.  The Diego microdebrider was used for trimming up the edges and helping to widen the natural ostium posteriorly and inferiorly.  The image guided system was used to make sure the openings were correct.  Once the left maxillary antrum was widened the 30 degrees scope was used to visualize inside the sinus which had some thickened mucus that was suctioned out a large polyp on the posterior inferior wall.  This was removed with a large 45 degree forceps to grab it and pull it from the posterior wall.  This was sent for permanent section.  The rest of the sinus appeared to be clear.  A cottonoid pledget was placed here temporarily while the right side was addressed.  The 0 degree and 30 degrees scopes were used for visualizing the middle meatus and the uncinate process was incised with a side biter.  This was removed with a 45 degree through biting forceps and the Diego microdebrider for trimming the mucosal edges.  The image guided system was used to find the natural ostium and make sure this was widened.  The Diego microdebrider was used to help  widen this posteriorly and inferiorly along with a side biter.  The 30 degrees scope was used to visualize inside the sinus and there is no evidence of any polypoid disease in the sinus.  A cottonoid pledget was placed here temporarily while the left side was revisualize.  The cottonoid pledgets were removed and there is no significant bleeding on either side.  Xerogel was placed into the middle meatus on the left side and on the right side as well.  A second piece was used to help cover over the middle turbinate remnant on the right side.  The airways were then visualized and showed open passageways on both sides that were significantly improved compared to before surgery. There was no signifcant bleeding. Nasal splints were applied to both sides of the septum using Xomed 0.37mm regular sized splints that were trimmed, and then held in position with a 3-0 Nylon through and through suture.  Once all the work was done inside the nose the outside of the nose was washed and dried.  Markings were then made on the outside of the nose as to the edge of the nasal bone and the proposed area for placing the Latera implants.  These were marked bilaterally and viewed to make sure  that these were symmetrical.  Using a double skin hook the edge of the nasal ala was stabilized and approximately 1/4 mL of 1% Xylocaine with epi 1: 100,000 was used for infiltration of the proposed track on each side.  This was allowed to sit for a few minutes.  The introducer cannula was then placed into the nasal alar edge on the left side while it was stabilized with the double hook.  This was passed through the subcu area over the lower lateral cartilage and then over the upper lateral cartilage to then create a tunnel overlying the nasal bone.  Dissected far enough until the previous markings were found.  The positions were rechecked and then the Latera implant was deployed and the cannula was removed.  There is no significant bleeding.   This was then done on the right side of the nose again stabilizing the nasal alar ridge and creating a puncture hole at the edge and carrying this through the subcu area overlying the cartilage until the previously marked distal and over the nasal bone was obtained.  After this was checked again for correct positioning the Latera implant was deployed and the cannula was removed.  There is no significant bleeding or bruising and both implants were felt to be sitting in the right position.  This gave better stability to the nasal ala on both sides and helped open the nasal valve bilaterally.  The patient was turned back over to anesthesia, and awakened, extubated, and taken to the PACU in satisfactory condition.  Dispo:   PACU to home  Plan: Ice, elevation, narcotic analgesia, steroid taper, and prophylactic antibiotics for the duration of indwelling nasal foreign bodies.  We will reevaluate the patient in the office in 6 days and remove the septal splints.  Return to work in 10 days, strenuous activities in two weeks.   Rebecca Roman 06/05/2018 11:53 AM

## 2018-06-05 NOTE — Anesthesia Preprocedure Evaluation (Signed)
Anesthesia Evaluation  Patient identified by MRN, date of birth, ID band Patient awake    Reviewed: Allergy & Precautions, NPO status , Patient's Chart, lab work & pertinent test results  Airway Mallampati: I  TM Distance: >3 FB     Dental   Pulmonary  Environmental allergies with chronic sinusitis   breath sounds clear to auscultation       Cardiovascular negative cardio ROS   Rhythm:Regular Rate:Normal     Neuro/Psych negative neurological ROS     GI/Hepatic negative GI ROS, Neg liver ROS,   Endo/Other  negative endocrine ROS  Renal/GU negative Renal ROS     Musculoskeletal   Abdominal   Peds  Hematology   Anesthesia Other Findings   Reproductive/Obstetrics                             Anesthesia Physical Anesthesia Plan  ASA: I  Anesthesia Plan: General   Post-op Pain Management:    Induction:   PONV Risk Score and Plan:   Airway Management Planned: Oral ETT  Additional Equipment:   Intra-op Plan:   Post-operative Plan:   Informed Consent: I have reviewed the patients History and Physical, chart, labs and discussed the procedure including the risks, benefits and alternatives for the proposed anesthesia with the patient or authorized representative who has indicated his/her understanding and acceptance.   Dental advisory given  Plan Discussed with: CRNA  Anesthesia Plan Comments:         Anesthesia Quick Evaluation

## 2018-06-05 NOTE — Transfer of Care (Signed)
Immediate Anesthesia Transfer of Care Note  Patient: Rebecca Roman  Procedure(s) Performed: IMAGE GUIDED SINUS SURGERY (N/A Nose) SEPTOPLASTY (N/A Nose) MAXILLARY ANTROSTOMY NO TISSUE REMOVAL (Bilateral Nose) BILATERAL INFERIOR TURBINATE REDUCTION / RIGHT MIDDLE TURBINATE REDUCTION (Bilateral Nose) NASAL VALVE REPAIR WITH NASAL IMPLANT (N/A Nose)  Patient Location: PACU  Anesthesia Type: General  Level of Consciousness: awake, alert  and patient cooperative  Airway and Oxygen Therapy: Patient Spontanous Breathing and Patient connected to supplemental oxygen  Post-op Assessment: Post-op Vital signs reviewed, Patient's Cardiovascular Status Stable, Respiratory Function Stable, Patent Airway and No signs of Nausea or vomiting  Post-op Vital Signs: Reviewed and stable  Complications: No apparent anesthesia complications

## 2018-06-05 NOTE — H&P (Signed)
H&P has been reviewedand patient reevaluated,  and no changes necessary. To be downloaded later.  

## 2018-06-05 NOTE — Anesthesia Procedure Notes (Signed)
Procedure Name: Intubation Date/Time: 06/05/2018 9:58 AM Performed by: Mayme Genta, CRNA Pre-anesthesia Checklist: Patient identified, Emergency Drugs available, Suction available, Patient being monitored and Timeout performed Patient Re-evaluated:Patient Re-evaluated prior to induction Oxygen Delivery Method: Circle system utilized Preoxygenation: Pre-oxygenation with 100% oxygen Induction Type: IV induction Ventilation: Mask ventilation without difficulty Laryngoscope Size: Miller and 2 Grade View: Grade I Tube type: Oral Rae Tube size: 7.0 mm Number of attempts: 1 Placement Confirmation: ETT inserted through vocal cords under direct vision,  positive ETCO2 and breath sounds checked- equal and bilateral Tube secured with: Tape Dental Injury: Teeth and Oropharynx as per pre-operative assessment

## 2018-06-05 NOTE — Anesthesia Postprocedure Evaluation (Signed)
Anesthesia Post Note  Patient: Rebecca Roman  Procedure(s) Performed: IMAGE GUIDED SINUS SURGERY (N/A Nose) SEPTOPLASTY (N/A Nose) MAXILLARY ANTROSTOMY NO TISSUE REMOVAL (Bilateral Nose) BILATERAL INFERIOR TURBINATE REDUCTION / RIGHT MIDDLE TURBINATE REDUCTION (Bilateral Nose) NASAL VALVE REPAIR WITH NASAL IMPLANT (N/A Nose)  Patient location during evaluation: PACU Anesthesia Type: General Level of consciousness: awake Pain management: pain level controlled Vital Signs Assessment: post-procedure vital signs reviewed and stable Respiratory status: respiratory function stable Cardiovascular status: stable Postop Assessment: no signs of nausea or vomiting Anesthetic complications: no    Veda Canning

## 2018-06-06 ENCOUNTER — Encounter: Payer: Self-pay | Admitting: Otolaryngology

## 2018-06-07 LAB — SURGICAL PATHOLOGY

## 2018-10-19 IMAGING — MG MM DIGITAL SCREENING BILAT W/ TOMO W/ CAD
9 of 12 series · 9 of 28 positions shown · non-contrast
Comparison: Previous exam(s).

CLINICAL DATA: Screening.

EXAM:
2D DIGITAL SCREENING BILATERAL MAMMOGRAM WITH 3D TOMO WITH CAD

[R MLO]
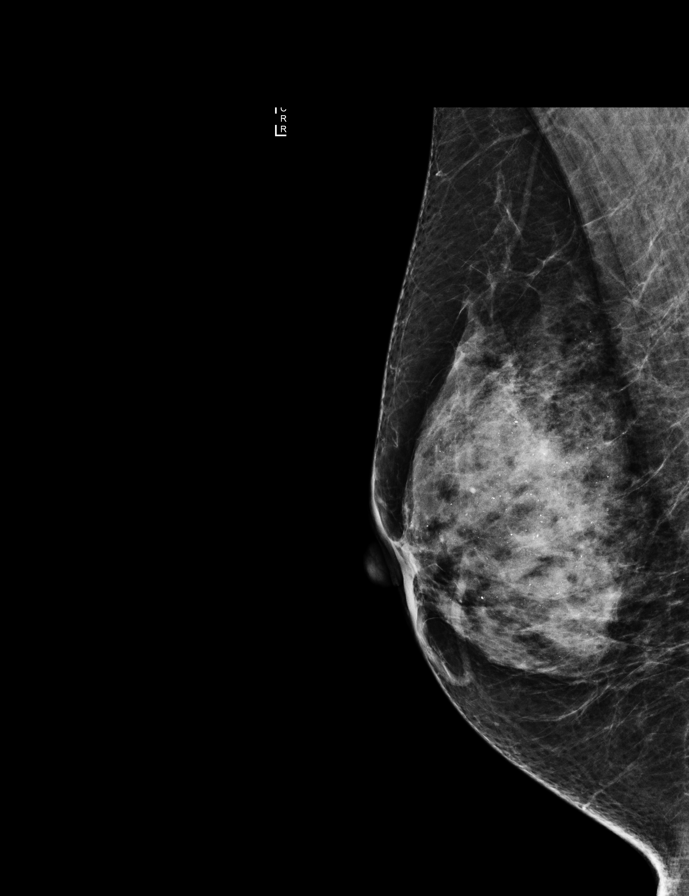

[R CC synth-2D]
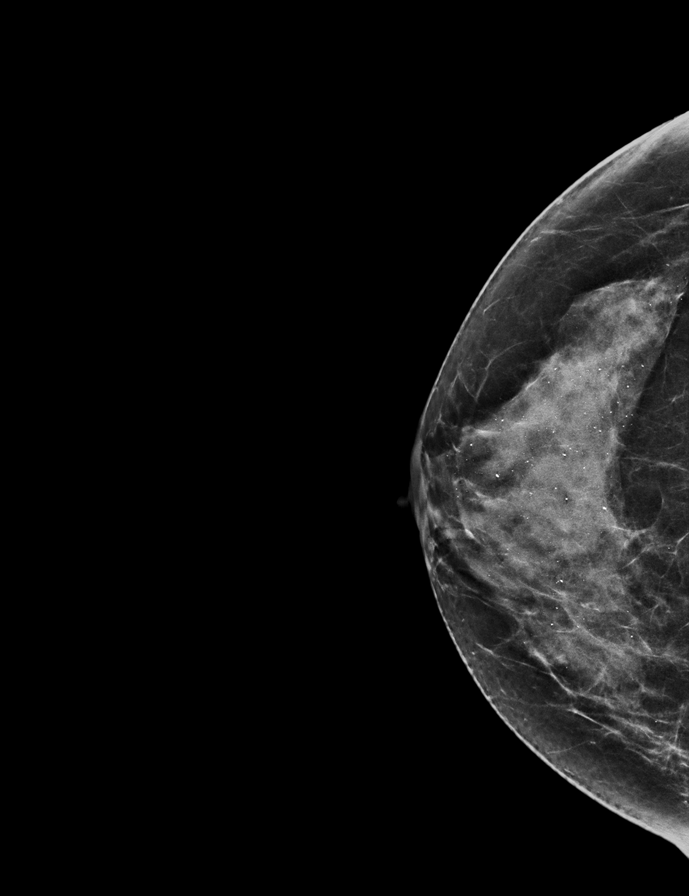

[L MLO]
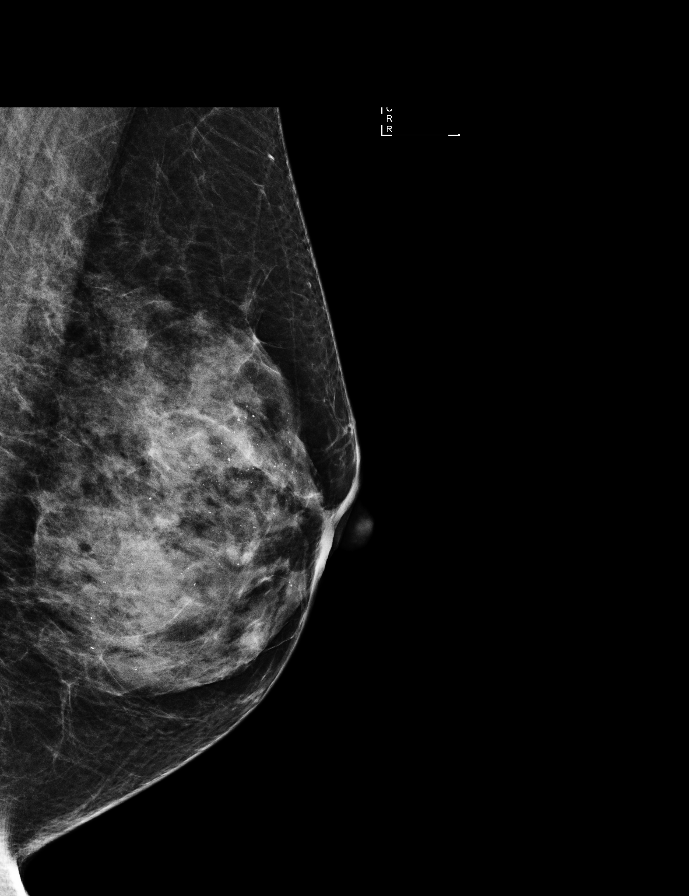

[R MLO synth-2D]
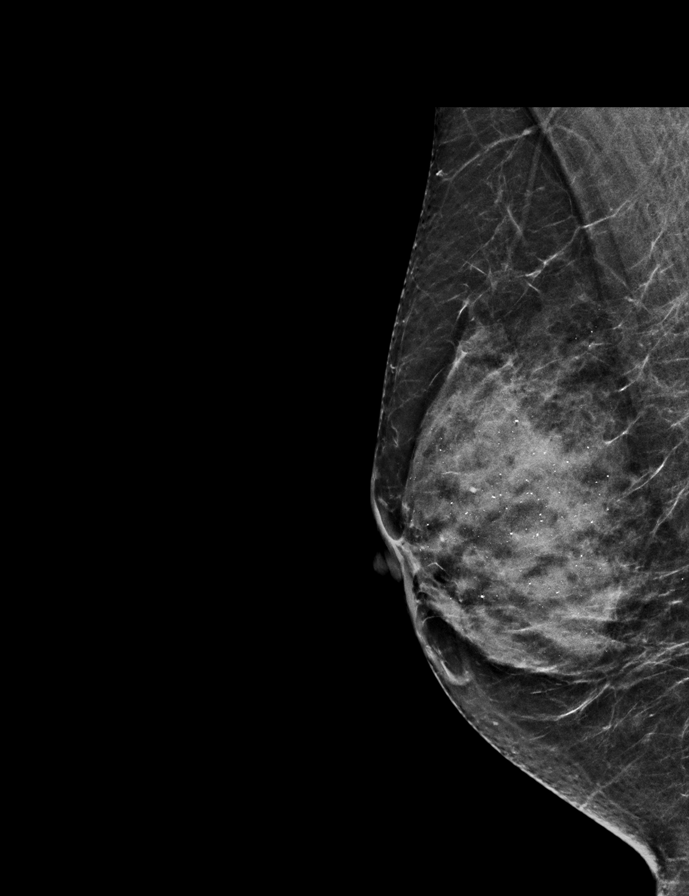

[L CC]
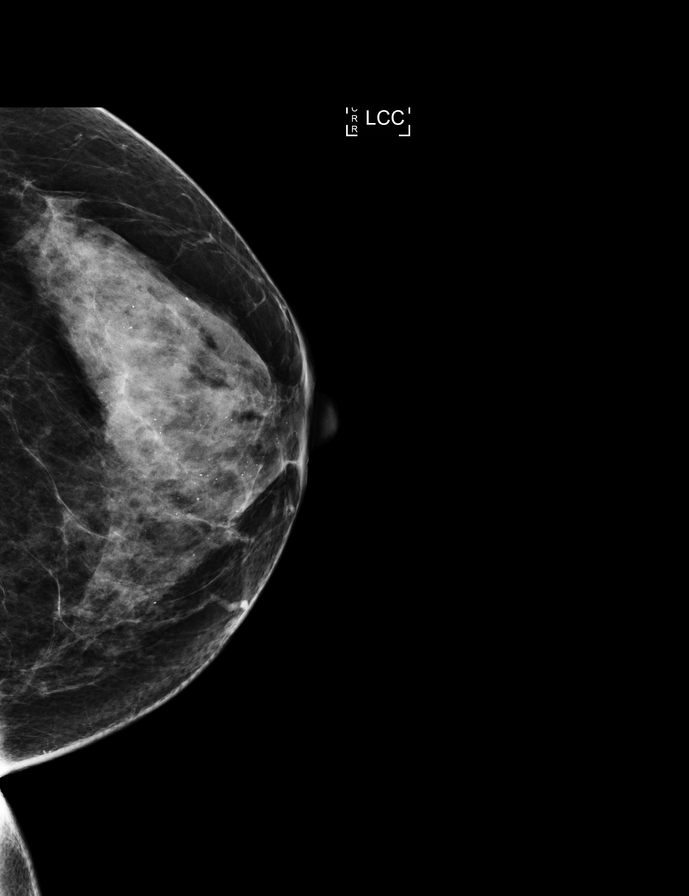

[L MLO synth-2D]
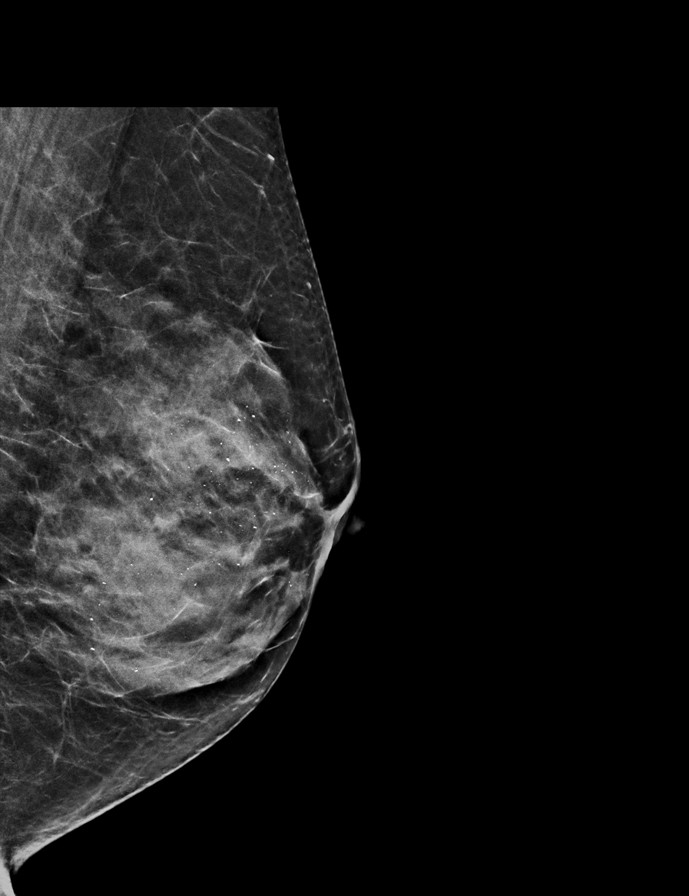

[L CC synth-2D]
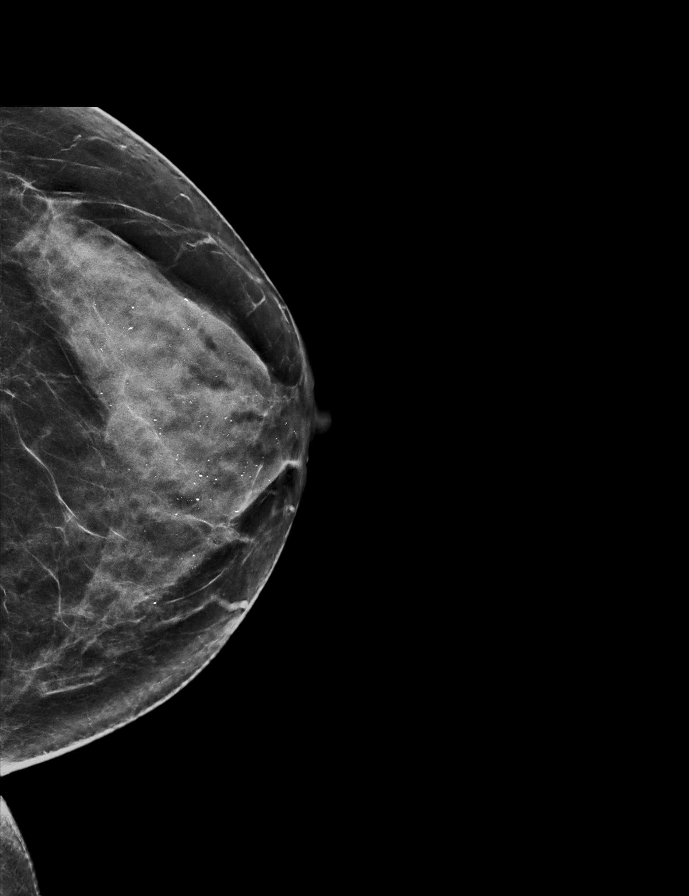

[R CC]
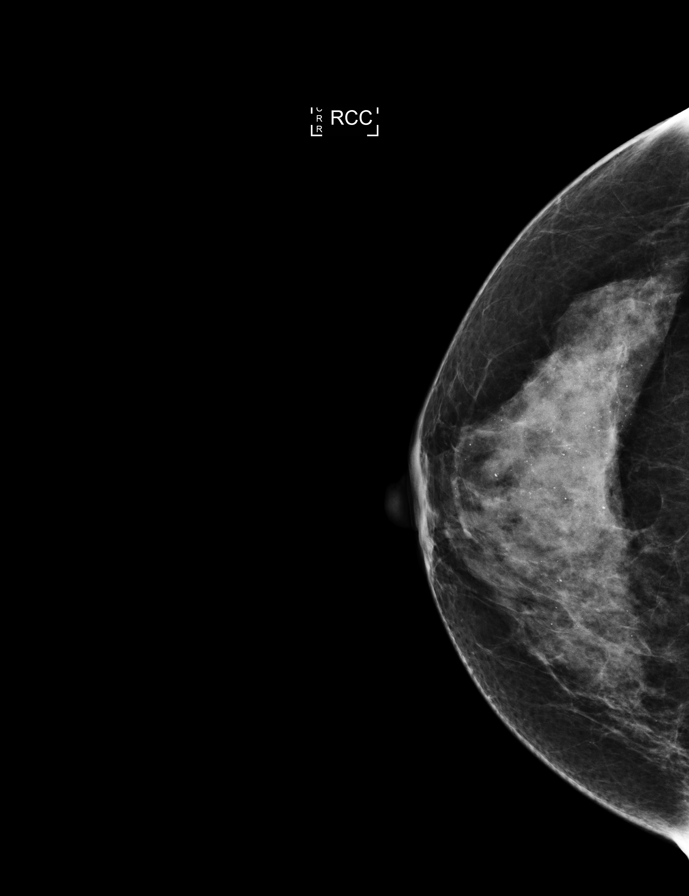

[L CC tomo · tomo slice 31/60.0]
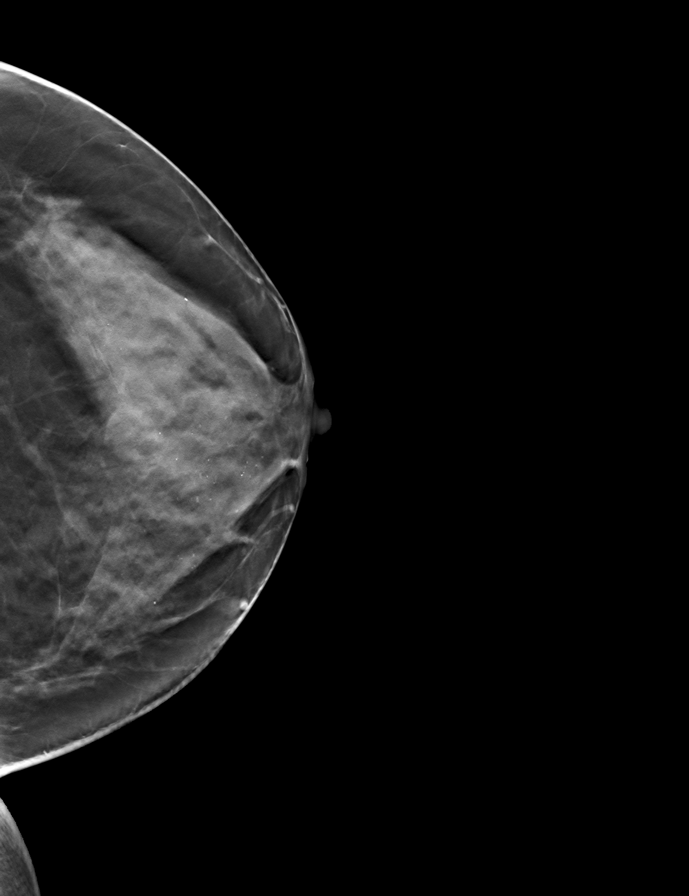

[9 of 28 positions shown; findings below may reference images not displayed]

ACR Breast Density Category c: The breast tissue is heterogeneously
dense, which may obscure small masses.
FINDINGS: There are no findings suspicious for malignancy. Images were
processed with CAD.
IMPRESSION: No mammographic evidence of malignancy. A result letter of this
screening mammogram will be mailed directly to the patient.

RECOMMENDATION:
Screening mammogram in one year. (Code:UA-[id number])

BI-RADS CATEGORY  1: Negative.

## 2018-11-18 ENCOUNTER — Other Ambulatory Visit: Payer: Self-pay | Admitting: Family Medicine

## 2018-11-18 DIAGNOSIS — Z1231 Encounter for screening mammogram for malignant neoplasm of breast: Secondary | ICD-10-CM

## 2018-12-10 ENCOUNTER — Ambulatory Visit (INDEPENDENT_AMBULATORY_CARE_PROVIDER_SITE_OTHER): Payer: BLUE CROSS/BLUE SHIELD | Admitting: Family Medicine

## 2018-12-10 ENCOUNTER — Encounter: Payer: Self-pay | Admitting: Family Medicine

## 2018-12-10 VITALS — BP 165/98 | HR 81 | Ht 69.0 in | Wt 164.0 lb

## 2018-12-10 DIAGNOSIS — Z1151 Encounter for screening for human papillomavirus (HPV): Secondary | ICD-10-CM | POA: Diagnosis not present

## 2018-12-10 DIAGNOSIS — R399 Unspecified symptoms and signs involving the genitourinary system: Secondary | ICD-10-CM | POA: Diagnosis not present

## 2018-12-10 DIAGNOSIS — Z01419 Encounter for gynecological examination (general) (routine) without abnormal findings: Secondary | ICD-10-CM | POA: Diagnosis not present

## 2018-12-10 DIAGNOSIS — Z124 Encounter for screening for malignant neoplasm of cervix: Secondary | ICD-10-CM

## 2018-12-10 LAB — POCT URINALYSIS DIPSTICK
Bilirubin, UA: NEGATIVE
GLUCOSE UA: NEGATIVE
Ketones, UA: NEGATIVE
Nitrite, UA: NEGATIVE
PH UA: 5 (ref 5.0–8.0)
Protein, UA: NEGATIVE
Spec Grav, UA: 1.01 (ref 1.010–1.025)
UROBILINOGEN UA: NEGATIVE U/dL — AB

## 2018-12-10 NOTE — Progress Notes (Signed)
  Subjective:     Rebecca Roman is a 52 y.o. female and is here for a comprehensive physical exam. The patient reports problems - some painful intercourse. Has IUD in place, no cycles. Occasional hot flashes. Notes more recent dysuria, and has long h/o UTI's. Has used cranberry juice and increasing water. Recent sinus surgery, which has improved her sleeping. She is no longer needing Ambien to sleep.     The following portions of the patient's history were reviewed and updated as appropriate: allergies, current medications, past family history, past medical history, past social history, past surgical history and problem list.  Review of Systems Pertinent items noted in HPI and remainder of comprehensive ROS otherwise negative.   Objective:    BP (!) 165/98   Pulse 81   Ht 5\' 9"  (1.753 m)   Wt 164 lb (74.4 kg)   BMI 24.22 kg/m  General appearance: alert, cooperative and appears stated age Head: Normocephalic, without obvious abnormality, atraumatic Neck: no adenopathy, supple, symmetrical, trachea midline and thyroid not enlarged, symmetric, no tenderness/mass/nodules Lungs: clear to auscultation bilaterally Breasts: normal appearance, no masses or tenderness Heart: regular rate and rhythm, S1, S2 normal, no murmur, click, rub or gallop Abdomen: soft, non-tender; bowel sounds normal; no masses,  no organomegaly Pelvic: cervix normal in appearance, external genitalia normal, no adnexal masses or tenderness, no cervical motion tenderness, uterus normal size, shape, and consistency and vaginal atrophy, IUD strings noted Extremities: Homans sign is negative, no sign of DVT Pulses: 2+ and symmetric Skin: Skin color, texture, turgor normal. No rashes or lesions Lymph nodes: Cervical, supraclavicular, and axillary nodes normal. Neurologic: Grossly normal    Assessment:    Healthy female exam.      Plan:      Problem List Items Addressed This Visit    None    Visit Diagnoses    UTI  symptoms    -  Primary   Relevant Orders   POCT Urinalysis Dipstick (Completed)   Urine Culture (Completed)   Screening for malignant neoplasm of cervix       Relevant Orders   Cytology - PAP( [place])   Encounter for gynecological examination without abnormal finding       Relevant Orders   MM DIGITAL SCREENING BILATERAL     Return in 1 year (on 12/11/2019).  See After Visit Summary for Counseling Recommendations

## 2018-12-10 NOTE — Progress Notes (Signed)
Having UTI symptoms.

## 2018-12-10 NOTE — Patient Instructions (Addendum)
Preventive Care 40-64 Years, Female Preventive care refers to lifestyle choices and visits with your health care provider that can promote health and wellness. What does preventive care include?   A yearly physical exam. This is also called an annual well check.  Dental exams once or twice a year.  Routine eye exams. Ask your health care provider how often you should have your eyes checked.  Personal lifestyle choices, including: ? Daily care of your teeth and gums. ? Regular physical activity. ? Eating a healthy diet. ? Avoiding tobacco and drug use. ? Limiting alcohol use. ? Practicing safe sex. ? Taking low-dose aspirin daily starting at age 50. ? Taking vitamin and mineral supplements as recommended by your health care provider. What happens during an annual well check? The services and screenings done by your health care provider during your annual well check will depend on your age, overall health, lifestyle risk factors, and family history of disease. Counseling Your health care provider may ask you questions about your:  Alcohol use.  Tobacco use.  Drug use.  Emotional well-being.  Home and relationship well-being.  Sexual activity.  Eating habits.  Work and work environment.  Method of birth control.  Menstrual cycle.  Pregnancy history. Screening You may have the following tests or measurements:  Height, weight, and BMI.  Blood pressure.  Lipid and cholesterol levels. These may be checked every 5 years, or more frequently if you are over 50 years old.  Skin check.  Lung cancer screening. You may have this screening every year starting at age 55 if you have a 30-pack-year history of smoking and currently smoke or have quit within the past 15 years.  Colorectal cancer screening. All adults should have this screening starting at age 50 and continuing until age 75. Your health care provider may recommend screening at age 45. You will have tests every  1-10 years, depending on your results and the type of screening test. People at increased risk should start screening at an earlier age. Screening tests may include: ? Guaiac-based fecal occult blood testing. ? Fecal immunochemical test (FIT). ? Stool DNA test. ? Virtual colonoscopy. ? Sigmoidoscopy. During this test, a flexible tube with a tiny camera (sigmoidoscope) is used to examine your rectum and lower colon. The sigmoidoscope is inserted through your anus into your rectum and lower colon. ? Colonoscopy. During this test, a long, thin, flexible tube with a tiny camera (colonoscope) is used to examine your entire colon and rectum.  Hepatitis C blood test.  Hepatitis B blood test.  Sexually transmitted disease (STD) testing.  Diabetes screening. This is done by checking your blood sugar (glucose) after you have not eaten for a while (fasting). You may have this done every 1-3 years.  Mammogram. This may be done every 1-2 years. Talk to your health care provider about when you should start having regular mammograms. This may depend on whether you have a family history of breast cancer.  BRCA-related cancer screening. This may be done if you have a family history of breast, ovarian, tubal, or peritoneal cancers.  Pelvic exam and Pap test. This may be done every 3 years starting at age 21. Starting at age 30, this may be done every 5 years if you have a Pap test in combination with an HPV test.  Bone density scan. This is done to screen for osteoporosis. You may have this scan if you are at high risk for osteoporosis. Discuss your test results, treatment options,   age 30, this may be done every 5 years if you have a Pap test in combination with an HPV test.   Bone density scan. This is done to screen for osteoporosis. You may have this scan if you are at high risk for osteoporosis.  Discuss your test results, treatment options, and if necessary, the need for more tests with your health care provider.  Vaccines  Your health care provider may recommend certain vaccines, such as:   Influenza vaccine. This is recommended every 51 year.   Tetanus, diphtheria, and acellular pertussis (Tdap, Td) vaccine. You may need a Td booster every 10 years.   Varicella  vaccine. You may need this if you have not been vaccinated.   Zoster vaccine. You may need this after age 51.   Measles, mumps, and rubella (MMR) vaccine. You may need at least one dose of MMR if you were born in 1957 or later. You may also need a second dose.   Pneumococcal 13-valent conjugate (PCV13) vaccine. You may need this if you have certain conditions and were not previously vaccinated.   Pneumococcal polysaccharide (PPSV23) vaccine. You may need one or two doses if you smoke cigarettes or if you have certain conditions.   Meningococcal vaccine. You may need this if you have certain conditions.   Hepatitis A vaccine. You may need this if you have certain conditions or if you travel or work in places where you may be exposed to hepatitis A.   Hepatitis B vaccine. You may need this if you have certain conditions or if you travel or work in places where you may be exposed to hepatitis B.   Haemophilus influenzae type b (Hib) vaccine. You may need this if you have certain conditions.  Talk to your health care provider about which screenings and vaccines you need and how often you need them.  This information is not intended to replace advice given to you by your health care provider. Make sure you discuss any questions you have with your health care provider.  Document Released: 12/24/2015 Document Revised: 01/17/2018 Document Reviewed: 09/28/2015  Elsevier Interactive Patient Education  2019 [employer] Inc[employer]    DASH Eating Plan  DASH stands for "Dietary Approaches to Stop Hypertension." The DASH eating plan is a healthy eating plan that has been shown to reduce high blood pressure (hypertension). It may also reduce your risk for type 2 diabetes, heart disease, and stroke. The DASH eating plan may also help with weight loss.  What are tips for following this plan?    General guidelines   Avoid eating more than 2,300 mg (milligrams) of salt (sodium) a day. If you have hypertension, you may need to reduce  your sodium intake to 1,500 mg a day.   Limit alcohol intake to no more than 1 drink a day for nonpregnant women and 2 drinks a day for men. One drink equals 12 oz of beer, 5 oz of wine, or 1 oz of hard liquor.   Work with your health care provider to maintain a healthy body weight or to lose weight. Ask what an ideal weight is for you.   Get at least 30 minutes of exercise that causes your heart to beat faster (aerobic exercise) most days of the week. Activities may include walking, swimming, or biking.   Work with your health care provider or diet and nutrition specialist (dietitian) to adjust your eating plan to your individual calorie needs.  Reading food labels     Check   food labels for the amount of sodium per serving. Choose foods with less than 5 percent of the Daily Value of sodium. Generally, foods with less than 300 mg of sodium per serving fit into this eating plan.   To find whole grains, look for the word "whole" as the first word in the ingredient list.  Shopping   Buy products labeled as "low-sodium" or "no salt added."   Buy fresh foods. Avoid canned foods and premade or frozen meals.  Cooking   Avoid adding salt when cooking. Use salt-free seasonings or herbs instead of table salt or sea salt. Check with your health care provider or pharmacist before using salt substitutes.   Do not fry foods. Cook foods using healthy methods such as baking, boiling, grilling, and broiling instead.   Cook with heart-healthy oils, such as olive, canola, soybean, or sunflower oil.  Meal planning   Eat a balanced diet that includes:  ? 5 or more servings of fruits and vegetables each day. At each meal, try to fill half of your plate with fruits and vegetables.  ? Up to 6-8 servings of whole grains each day.  ? Less than 6 oz of lean meat, poultry, or fish each day. A 3-oz serving of meat is about the same size as a deck of cards. One egg equals 1 oz.  ? 2 servings of low-fat dairy each day.  ? A serving  of nuts, seeds, or beans 5 times each week.  ? Heart-healthy fats. Healthy fats called Omega-3 fatty acids are found in foods such as flaxseeds and coldwater fish, like sardines, salmon, and mackerel.   Limit how much you eat of the following:  ? Canned or prepackaged foods.  ? Food that is high in trans fat, such as fried foods.  ? Food that is high in saturated fat, such as fatty meat.  ? Sweets, desserts, sugary drinks, and other foods with added sugar.  ? Full-fat dairy products.   Do not salt foods before eating.   Try to eat at least 2 vegetarian meals each week.   Eat more home-cooked food and less restaurant, buffet, and fast food.   When eating at a restaurant, ask that your food be prepared with less salt or no salt, if possible.  What foods are recommended?  The items listed may not be a complete list. Talk with your dietitian about what dietary choices are best for you.  Grains  Whole-grain or whole-wheat bread. Whole-grain or whole-wheat pasta. Brown rice. Oatmeal. Quinoa. Bulgur. Whole-grain and low-sodium cereals. Pita bread. Low-fat, low-sodium crackers. Whole-wheat flour tortillas.  Vegetables  Fresh or frozen vegetables (raw, steamed, roasted, or grilled). Low-sodium or reduced-sodium tomato and vegetable juice. Low-sodium or reduced-sodium tomato sauce and tomato paste. Low-sodium or reduced-sodium canned vegetables.  Fruits  All fresh, dried, or frozen fruit. Canned fruit in natural juice (without added sugar).  Meat and other protein foods  Skinless chicken or turkey. Ground chicken or turkey. Pork with fat trimmed off. Fish and seafood. Egg whites. Dried beans, peas, or lentils. Unsalted nuts, nut butters, and seeds. Unsalted canned beans. Lean cuts of beef with fat trimmed off. Low-sodium, lean deli meat.  Dairy  Low-fat (1%) or fat-free (skim) milk. Fat-free, low-fat, or reduced-fat cheeses. Nonfat, low-sodium ricotta or cottage cheese. Low-fat or nonfat yogurt. Low-fat, low-sodium  cheese.  Fats and oils  Soft margarine without trans fats. Vegetable oil. Low-fat, reduced-fat, or light mayonnaise and salad dressings (reduced-sodium). Canola, safflower, olive, soybean,   and sunflower oils. Avocado.  Seasoning and other foods  Herbs. Spices. Seasoning mixes without salt. Unsalted popcorn and pretzels. Fat-free sweets.  What foods are not recommended?  The items listed may not be a complete list. Talk with your dietitian about what dietary choices are best for you.  Grains  Baked goods made with fat, such as croissants, muffins, or some breads. Dry pasta or rice meal packs.  Vegetables  Creamed or fried vegetables. Vegetables in a cheese sauce. Regular canned vegetables (not low-sodium or reduced-sodium). Regular canned tomato sauce and paste (not low-sodium or reduced-sodium). Regular tomato and vegetable juice (not low-sodium or reduced-sodium). Pickles. Olives.  Fruits  Canned fruit in a light or heavy syrup. Fried fruit. Fruit in cream or butter sauce.  Meat and other protein foods  Fatty cuts of meat. Ribs. Fried meat. Bacon. Sausage. Bologna and other processed lunch meats. Salami. Fatback. Hotdogs. Bratwurst. Salted nuts and seeds. Canned beans with added salt. Canned or smoked fish. Whole eggs or egg yolks. Chicken or turkey with skin.  Dairy  Whole or 2% milk, cream, and half-and-half. Whole or full-fat cream cheese. Whole-fat or sweetened yogurt. Full-fat cheese. Nondairy creamers. Whipped toppings. Processed cheese and cheese spreads.  Fats and oils  Butter. Stick margarine. Lard. Shortening. Ghee. Bacon fat. Tropical oils, such as coconut, palm kernel, or palm oil.  Seasoning and other foods  Salted popcorn and pretzels. Onion salt, garlic salt, seasoned salt, table salt, and sea salt. Worcestershire sauce. Tartar sauce. Barbecue sauce. Teriyaki sauce. Soy sauce, including reduced-sodium. Steak sauce. Canned and packaged gravies. Fish sauce. Oyster sauce. Cocktail sauce. Horseradish  that you find on the shelf. Ketchup. Mustard. Meat flavorings and tenderizers. Bouillon cubes. Hot sauce and Tabasco sauce. Premade or packaged marinades. Premade or packaged taco seasonings. Relishes. Regular salad dressings.  Where to find more information:   National Heart, Lung, and Blood Institute: www.nhlbi.nih.gov   American Heart Association: www.heart.org  Summary   The DASH eating plan is a healthy eating plan that has been shown to reduce high blood pressure (hypertension). It may also reduce your risk for type 2 diabetes, heart disease, and stroke.   With the DASH eating plan, you should limit salt (sodium) intake to 2,300 mg a day. If you have hypertension, you may need to reduce your sodium intake to 1,500 mg a day.   When on the DASH eating plan, aim to eat more fresh fruits and vegetables, whole grains, lean proteins, low-fat dairy, and heart-healthy fats.   Work with your health care provider or diet and nutrition specialist (dietitian) to adjust your eating plan to your individual calorie needs.  This information is not intended to replace advice given to you by your health care provider. Make sure you discuss any questions you have with your health care provider.  Document Released: 11/16/2011 Document Revised: 11/20/2016 Document Reviewed: 11/20/2016  Elsevier Interactive Patient Education  2019 [employer] Inc[employer]

## 2018-12-12 ENCOUNTER — Encounter: Payer: Self-pay | Admitting: Family Medicine

## 2018-12-12 LAB — URINE CULTURE

## 2018-12-12 MED ORDER — AMOXICILLIN 500 MG PO CAPS: 500.0000 mg | ORAL_CAPSULE | Freq: Three times a day (TID) | ORAL | 0 refills | Status: DC

## 2018-12-16 LAB — CYTOLOGY - PAP
Diagnosis: NEGATIVE
HPV (WINDOPATH): NOT DETECTED

## 2018-12-26 ENCOUNTER — Ambulatory Visit
Admission: RE | Admit: 2018-12-26 | Discharge: 2018-12-26 | Disposition: A | Discharge status: Home / Self Care | Payer: BLUE CROSS/BLUE SHIELD | Source: Ambulatory Visit | Attending: Family Medicine | Admitting: Family Medicine

## 2018-12-26 DIAGNOSIS — Z1231 Encounter for screening mammogram for malignant neoplasm of breast: Secondary | ICD-10-CM | POA: Diagnosis not present

## 2019-02-25 ENCOUNTER — Encounter: Payer: Self-pay | Admitting: *Deleted

## 2019-02-25 ENCOUNTER — Other Ambulatory Visit: Payer: Self-pay

## 2019-02-25 ENCOUNTER — Ambulatory Visit (INDEPENDENT_AMBULATORY_CARE_PROVIDER_SITE_OTHER): Payer: BLUE CROSS/BLUE SHIELD | Admitting: Family Medicine

## 2019-02-25 ENCOUNTER — Encounter: Payer: Self-pay | Admitting: Family Medicine

## 2019-02-25 VITALS — BP 158/88 | HR 72 | Wt 168.6 lb

## 2019-02-25 DIAGNOSIS — N390 Urinary tract infection, site not specified: Secondary | ICD-10-CM | POA: Diagnosis not present

## 2019-02-25 NOTE — Progress Notes (Signed)
   Subjective:    Patient ID: Rebecca Roman is a 53 y.o. female presenting with Discuss UTI  on 02/25/2019  HPI: Here today to work discuss chronic urinary tract infections. She has had 3 that I can still document with electronic health record. Visits prior to 2012 are much more difficult to follow as they were on paper then. I have evidence of imaging which shows a kidney stone from 2007.  Review of Systems  Constitutional: Negative for chills and fever.  Respiratory: Negative for shortness of breath.   Cardiovascular: Negative for chest pain.  Gastrointestinal: Negative for abdominal pain, nausea and vomiting.  Genitourinary: Negative for dysuria.  Skin: Negative for rash.      Objective:    BP (!) 158/88   Pulse 72   Wt 168 lb 9.6 oz (76.5 kg)   BMI 24.90 kg/m  Physical Exam Constitutional:      General: She is not in acute distress.    Appearance: She is well-developed.  HENT:     Head: Normocephalic and atraumatic.  Eyes:     General: No scleral icterus. Neck:     Musculoskeletal: Neck supple.  Cardiovascular:     Rate and Rhythm: Normal rate.  Pulmonary:     Effort: Pulmonary effort is normal.  Abdominal:     Palpations: Abdomen is soft.  Skin:    General: Skin is warm and dry.  Neurological:     Mental Status: She is alert and oriented to person, place, and time.         Assessment & Plan:   Problem List Items Addressed This Visit    None    Visit Diagnoses    Recurrent urinary tract infection    -  Primary   forms filled out, to document what has been found here. She will take back to [place] with her.      Total face-to-face time with patient: 15 minutes. Over 50% of encounter was spent on counseling and coordination of care. Return if symptoms worsen or fail to improve.  Donnamae Jude 02/25/2019 8:54 AM

## 2019-11-19 ENCOUNTER — Encounter: Payer: Self-pay | Admitting: Radiology

## 2019-12-02 ENCOUNTER — Other Ambulatory Visit: Payer: Self-pay | Admitting: Family Medicine

## 2019-12-02 DIAGNOSIS — Z1231 Encounter for screening mammogram for malignant neoplasm of breast: Secondary | ICD-10-CM

## 2019-12-16 ENCOUNTER — Ambulatory Visit (INDEPENDENT_AMBULATORY_CARE_PROVIDER_SITE_OTHER): Payer: BC Managed Care – PPO | Admitting: Obstetrics & Gynecology

## 2019-12-16 ENCOUNTER — Other Ambulatory Visit: Payer: Self-pay

## 2019-12-16 VITALS — BP 129/85 | HR 85 | Ht 69.0 in | Wt 170.0 lb

## 2019-12-16 DIAGNOSIS — N951 Menopausal and female climacteric states: Secondary | ICD-10-CM

## 2019-12-16 DIAGNOSIS — Z01419 Encounter for gynecological examination (general) (routine) without abnormal findings: Secondary | ICD-10-CM

## 2019-12-16 DIAGNOSIS — Z124 Encounter for screening for malignant neoplasm of cervix: Secondary | ICD-10-CM

## 2019-12-16 DIAGNOSIS — Z1151 Encounter for screening for human papillomavirus (HPV): Secondary | ICD-10-CM

## 2019-12-16 DIAGNOSIS — R635 Abnormal weight gain: Secondary | ICD-10-CM

## 2019-12-16 MED ORDER — ESTRADIOL-LEVONORGESTREL 0.045-0.015 MG/DAY TD PTWK: 1.0000 | MEDICATED_PATCH | TRANSDERMAL | 6 refills | Status: DC

## 2019-12-16 NOTE — Progress Notes (Signed)
Concerns about whether she is in menopause. Some pain with intercourse.

## 2019-12-16 NOTE — Progress Notes (Signed)
Subjective:    Rebecca Roman is a 54 y.o. married P2 (82yo daughter and 59 yo son)  who presents for an annual exam. She is having symptoms of menopause and wants treatment!  The patient is sexually active. GYN screening history: last pap: was normal. The patient wears seatbelts: yes. The patient participates in regular exercise: yes. Has the patient ever been transfused or tattooed?: no. The patient reports that there is not domestic violence in her life.   Menstrual History: OB History    Gravida  3   Para  2   Term  2   Preterm  0   AB  0   Living  1     SAB  0   TAB  0   Ectopic  0   Multiple  0   Live Births  1           Menarche age: 42 No LMP recorded. (Menstrual status: IUD).    The following portions of the patient's history were reviewed and updated as appropriate: allergies, current medications, past family history, past medical history, past social history, past surgical history and problem list.  Review of Systems Pertinent items are noted in HPI.   She will get colon screening this year. Married since 2018 + colon cancer in paternal GF, no breast/gyn cancer, + thyroid disease in her siblings Works for a Facilities manager firm, also has Therapist, music She has had a flu vaccine this season. She gets fasting labs with her primary care doc next month.   Objective:    BP 129/85   Pulse 85   Ht 5\' 9"  (1.753 m)   Wt 170 lb (77.1 kg)   BMI 25.10 kg/m   General Appearance:    Alert, cooperative, no distress, appears stated age  Head:    Normocephalic, without obvious abnormality, atraumatic  Eyes:    PERRL, conjunctiva/corneas clear, EOM's intact, fundi    benign, both eyes  Ears:    Normal TM's and external ear canals, both ears  Nose:   Nares normal, septum midline, mucosa normal, no drainage    or sinus tenderness  Throat:   Lips, mucosa, and tongue normal; teeth and gums normal  Neck:   Supple, symmetrical, trachea midline, no  adenopathy;    thyroid:  no enlargement/tenderness/nodules; no carotid   bruit or JVD  Back:     Symmetric, no curvature, ROM normal, no CVA tenderness  Lungs:     Clear to auscultation bilaterally, respirations unlabored  Chest Wall:    No tenderness or deformity   Heart:    Regular rate and rhythm, S1 and S2 normal, no murmur, rub   or gallop  Breast Exam:    No tenderness, masses, or nipple abnormality  Abdomen:     Soft, non-tender, bowel sounds active all four quadrants,    no masses, no organomegaly  Genitalia:    Normal female without lesion, discharge or tenderness, normal size and shape, anteverted, mobile, non-tender, normal adnexal exam, IUD strings seen     Extremities:   Extremities normal, atraumatic, no cyanosis or edema  Pulses:   2+ and symmetric all extremities  Skin:   Skin color, texture, turgor normal, no rashes or lesions  Lymph nodes:   Cervical, supraclavicular, and axillary nodes normal  Neurologic:   CNII-XII intact, normal strength, sensation and reflexes    throughout  .    Assessment:    Healthy female exam.   Menopause symptoms  Plan:     Thin prep Pap smear. with cotesting Mammogram next week climarapro patch Check FSH If elevated, then remove IUD (WITH Hurricane spray) Check TSH

## 2019-12-17 ENCOUNTER — Other Ambulatory Visit: Payer: Self-pay | Admitting: Obstetrics & Gynecology

## 2019-12-17 DIAGNOSIS — R7989 Other specified abnormal findings of blood chemistry: Secondary | ICD-10-CM

## 2019-12-17 LAB — TSH: TSH: 4.93 u[IU]/mL — ABNORMAL HIGH (ref 0.450–4.500)

## 2019-12-17 LAB — FOLLICLE STIMULATING HORMONE: FSH: 82.8 m[IU]/mL

## 2019-12-17 MED ORDER — LEVOTHYROXINE SODIUM 50 MCG PO TABS: 50.0000 ug | ORAL_TABLET | Freq: Every day | ORAL | 12 refills | Status: DC

## 2019-12-17 NOTE — Progress Notes (Signed)
Her TSH is elevated and I prescribed synthroid 50 mcg. She will come in 2 months for a re check TSH.

## 2019-12-19 ENCOUNTER — Other Ambulatory Visit: Payer: Self-pay | Admitting: Obstetrics & Gynecology

## 2019-12-19 ENCOUNTER — Telehealth: Payer: Self-pay | Admitting: *Deleted

## 2019-12-19 MED ORDER — MEDROXYPROGESTERONE ACETATE 2.5 MG PO TABS: 2.5000 mg | ORAL_TABLET | Freq: Every day | ORAL | 6 refills | Status: DC

## 2019-12-19 MED ORDER — ESTROGENS CONJUGATED 0.625 MG PO TABS: ORAL_TABLET | ORAL | 6 refills | Status: DC

## 2019-12-19 NOTE — Telephone Encounter (Signed)
Pt called to let us know the RX for her patch was going to cost her $600 and could we call in something else for her. Dr Hulan Fray notified and she will send in a new RX for pt.

## 2019-12-19 NOTE — Progress Notes (Signed)
Premarin and provera continuous prescribed because combipatch was cost prohibitive.

## 2019-12-22 ENCOUNTER — Other Ambulatory Visit: Payer: Self-pay | Admitting: *Deleted

## 2019-12-22 LAB — CYTOLOGY - PAP
Comment: NEGATIVE
Comment: NEGATIVE
Comment: NEGATIVE
Diagnosis: UNDETERMINED — AB
HPV 16: NEGATIVE
HPV 18 / 45: NEGATIVE
High risk HPV: POSITIVE — AB

## 2019-12-22 MED ORDER — ESTRADIOL 0.5 MG PO TABS: 0.5000 mg | ORAL_TABLET | Freq: Every day | ORAL | 6 refills | Status: DC

## 2019-12-22 NOTE — Progress Notes (Signed)
Erroneous

## 2019-12-30 ENCOUNTER — Ambulatory Visit
Admission: RE | Admit: 2019-12-30 | Discharge: 2019-12-30 | Disposition: A | Discharge status: Home / Self Care | Payer: BC Managed Care – PPO | Source: Ambulatory Visit | Attending: Family Medicine | Admitting: Family Medicine

## 2019-12-30 ENCOUNTER — Other Ambulatory Visit: Payer: Self-pay

## 2019-12-30 DIAGNOSIS — Z1231 Encounter for screening mammogram for malignant neoplasm of breast: Secondary | ICD-10-CM | POA: Insufficient documentation

## 2020-01-14 ENCOUNTER — Other Ambulatory Visit: Payer: Self-pay | Admitting: Family Medicine

## 2020-01-19 ENCOUNTER — Telehealth: Payer: Self-pay | Admitting: Family Medicine

## 2020-01-19 MED ORDER — ESTRADIOL 0.5 MG PO TABS: 0.5000 mg | ORAL_TABLET | Freq: Every day | ORAL | 2 refills | Status: DC

## 2020-01-19 NOTE — Telephone Encounter (Signed)
Patient left message on voicemail, needing her Estrace resent for a 90 day supply.   Revised Rx and routed to pharmacy.

## 2020-02-25 ENCOUNTER — Ambulatory Visit (INDEPENDENT_AMBULATORY_CARE_PROVIDER_SITE_OTHER): Payer: BC Managed Care – PPO | Admitting: Advanced Practice Midwife

## 2020-02-25 ENCOUNTER — Other Ambulatory Visit: Payer: Self-pay

## 2020-02-25 ENCOUNTER — Encounter: Payer: Self-pay | Admitting: Advanced Practice Midwife

## 2020-02-25 VITALS — BP 156/90 | HR 85 | Wt 169.0 lb

## 2020-02-25 DIAGNOSIS — R7989 Other specified abnormal findings of blood chemistry: Secondary | ICD-10-CM

## 2020-02-25 DIAGNOSIS — Z113 Encounter for screening for infections with a predominantly sexual mode of transmission: Secondary | ICD-10-CM

## 2020-02-25 DIAGNOSIS — B373 Candidiasis of vulva and vagina: Secondary | ICD-10-CM | POA: Diagnosis not present

## 2020-02-25 DIAGNOSIS — N898 Other specified noninflammatory disorders of vagina: Secondary | ICD-10-CM

## 2020-02-25 DIAGNOSIS — Z975 Presence of (intrauterine) contraceptive device: Secondary | ICD-10-CM | POA: Diagnosis not present

## 2020-02-25 LAB — POCT URINALYSIS DIPSTICK
Blood, UA: NEGATIVE
Leukocytes, UA: NEGATIVE

## 2020-02-25 MED ORDER — FLUCONAZOLE 150 MG PO TABS: 150.0000 mg | ORAL_TABLET | Freq: Once | ORAL | 0 refills | Status: AC

## 2020-02-25 NOTE — Patient Instructions (Signed)

## 2020-02-25 NOTE — Progress Notes (Signed)
Having burning with urination and vaginal irritation.    Check placement of IUD  Had an episode pf random spotting  Dr Hulan Fray placed pt on thyroid meds,  As well as hormones for her vaginal dryness

## 2020-02-25 NOTE — Progress Notes (Signed)
GYNECOLOGY PROBLEM CARE ENCOUNTER NOTE  History:     Rebecca Roman is a 54 y.o. [id number] female here for a routine annual gynecologic exam.  Current complaints: vaginal irritation and concern for either yeast infection or Bacterial Vaginosis. Patient also states her partner has been scratched twice by her IUD during intercourse and she would like to have the position checked today. Denies abnormal vaginal bleeding, discharge, pelvic pain, problems with intercourse or other gynecologic concerns.    Gynecologic History No LMP recorded. (Menstrual status: IUD). Contraception: IUD Last Pap: 12/2019. Results were: ASCUS wih + HPV Last mammogram: 12/2019. Results were: normal  Obstetric History OB History  Gravida Para Term Preterm AB Living  3 2 2  0 0 1  SAB TAB Ectopic Multiple Live Births  0 0 0 0 1    # Outcome Date GA Lbr Len/2nd Weight Sex Delivery Anes PTL Lv  3 Term 07/27/11 [redacted]w[redacted]d  9 lb 10.5 oz (4.38 kg) M CS-LTranv Spinal  LIV  2 Gravida              Birth Comments: System Generated. Please review and update pregnancy details.  1 Term             Past Medical History:  Diagnosis Date  . Cancer (HCC)    basal cell on lt leg  . Environmental allergies   . Motion sickness   . Wears contact lenses     Past Surgical History:  Procedure Laterality Date  . CESAREAN SECTION  07/27/2011   Procedure: CESAREAN SECTION;  Surgeon: Mora Bellman, MD;  Location: [facility];  Service: Gynecology;  Laterality: N/A;  . COLONOSCOPY    . IMAGE GUIDED SINUS SURGERY N/A 06/05/2018   Procedure: IMAGE GUIDED SINUS SURGERY;  Surgeon: Margaretha Sheffield, MD;  Location: [place];  Service: ENT;  Laterality: N/A;  GAVE DISK TO BRENDA 4.4.19 DS gave 2nd disk to brenda 4.25.19  . MAXILLARY ANTROSTOMY Bilateral 06/05/2018   Procedure: MAXILLARY ANTROSTOMY NO TISSUE REMOVAL;  Surgeon: Margaretha Sheffield, MD;  Location: [place];  Service: ENT;  Laterality: Bilateral;  . SEPTOPLASTY N/A  06/05/2018   Procedure: SEPTOPLASTY;  Surgeon: Margaretha Sheffield, MD;  Location: [place];  Service: ENT;  Laterality: N/A;  . TURBINATE REDUCTION Bilateral 06/05/2018   Procedure: BILATERAL INFERIOR TURBINATE REDUCTION / RIGHT MIDDLE [place];  Surgeon: Margaretha Sheffield, MD;  Location: [place];  Service: ENT;  Laterality: Bilateral;    Current Outpatient Medications on File Prior to Visit  Medication Sig Dispense Refill  . cetirizine (ZYRTEC) 10 MG tablet Take 10 mg by mouth daily.    Marland Kitchen estradiol (ESTRACE) 0.5 MG tablet Take 1 tablet (0.5 mg total) by mouth daily. 90 tablet 2  . estrogens, conjugated, (PREMARIN) 0.625 MG tablet Take daily 90 tablet 6  . fluticasone (FLONASE) 50 MCG/ACT nasal spray Place 2 sprays into both nostrils daily. 16 g 11  . levonorgestrel (MIRENA) 20 MCG/24HR IUD 1 each by Intrauterine route once.    Marland Kitchen levothyroxine (SYNTHROID) 50 MCG tablet Take 1 tablet (50 mcg total) by mouth daily before breakfast. 30 tablet 12  . medroxyPROGESTERone (PROVERA) 2.5 MG tablet Take 1 tablet (2.5 mg total) by mouth daily. 90 tablet 6  . montelukast (SINGULAIR) 10 MG tablet Take 1 tablet (10 mg total) by mouth at bedtime. 90 tablet 3  . pseudoephedrine (SUDAFED) 30 MG tablet Take 30 mg by mouth every 6 (six) hours as needed for congestion.    Marland Kitchen  estradiol-levonorgestrel (CLIMARAPRO) 0.045-0.015 MG/DAY Place 1 patch onto the skin once a week. 12 patch 6   No current facility-administered medications on file prior to visit.    Allergies  Allergen Reactions  . Scallops [Shellfish Allergy] Nausea And Vomiting    Only scallops, not any other seafood    Social History:  reports that she has never smoked. She has never used smokeless tobacco. She reports current alcohol use of about 4.0 standard drinks of alcohol per week. She reports that she does not use drugs.  Family History  Problem Relation Age of Onset  . Stroke Father   . Hypertension Father   .  Thyroid disease Father   . Thyroid disease Brother   . Thyroid disease Sister   . Breast cancer Neg Hx     The following portions of the patient's history were reviewed and updated as appropriate: allergies, current medications, past family history, past medical history, past social history, past surgical history and problem list.  Review of Systems Pertinent items noted in HPI and remainder of comprehensive ROS otherwise negative.  Physical Exam:  BP (!) 156/90   Pulse 85   Wt 169 lb (76.7 kg)   BMI 24.96 kg/m  CONSTITUTIONAL: Well-developed, well-nourished female in no acute distress.  HENT:  Normocephalic, atraumatic, External right and left ear normal. Oropharynx is clear and moist EYES: Conjunctivae and EOM are normal. Pupils are equal, round, and reactive to light. No scleral icterus.  NECK: Normal range of motion, supple, no masses.  Normal thyroid.  SKIN: Skin is warm and dry. No rash noted. Not diaphoretic. No erythema. No pallor. MUSCULOSKELETAL: Normal range of motion. No tenderness.  No cyanosis, clubbing, or edema.  2+ distal pulses. NEUROLOGIC: Alert and oriented to person, place, and time. Normal reflexes, muscle tone coordination.  PSYCHIATRIC: Normal mood and affect. Normal behavior. Normal judgment and thought content. CARDIOVASCULAR: Normal heart rate noted, regular rhythm RESPIRATORY: Clear to auscultation bilaterally. Effort and breath sounds normal, no problems with respiration noted. BREASTS: Symmetric in size. No masses, tenderness, skin changes, nipple drainage, or lymphadenopathy bilaterally. Performed in the presence of a chaperone. ABDOMEN: Soft, no distention noted.  No tenderness, rebound or guarding.  PELVIC: Normal appearing external genitalia and urethral meatus; Introitus and vaginal vault slightly irritated looking, normal appearing cervix. Thick white discharge noted c/w vulvovaginal candidiasis.  Normal uterine size, no other palpable masses, no  uterine or adnexal tenderness.  Performed in the presence of a chaperone.   Assessment and Plan:    1. Vaginal irritation - Tx presumptively with Diflucan - Cervicovaginal ancillary only - POCT Urinalysis Dipstick  2. Abnormal TSH - Started on Synthroid by Dr. Hulan Fray 12/2019 - TSH  3. IUD (intrauterine device) in place  Will follow up results of swab and manage accordingly. Routine preventative health maintenance measures emphasized. Please refer to After Visit Summary for other counseling recommendations.      Total visit time 30 minutes. Greater than 50% of visit spent in counseling and coordination of care.  Mallie Snooks, MSN, CNM Certified Nurse Midwife, [facility] for [employer], [facility] 02/25/20 8:08 PM

## 2020-02-26 LAB — TSH: TSH: 2.86 u[IU]/mL (ref 0.450–4.500)

## 2020-02-27 LAB — CERVICOVAGINAL ANCILLARY ONLY
Bacterial Vaginitis (gardnerella): NEGATIVE
Candida Glabrata: NEGATIVE
Candida Vaginitis: POSITIVE — AB
Chlamydia: NEGATIVE
Comment: NEGATIVE
Comment: NEGATIVE
Comment: NEGATIVE
Comment: NEGATIVE
Comment: NEGATIVE
Comment: NORMAL
Neisseria Gonorrhea: NEGATIVE
Trichomonas: NEGATIVE

## 2020-08-27 ENCOUNTER — Emergency Department: Payer: BC Managed Care – PPO

## 2020-08-27 ENCOUNTER — Emergency Department: Payer: BC Managed Care – PPO | Admitting: Anesthesiology

## 2020-08-27 ENCOUNTER — Other Ambulatory Visit: Payer: Self-pay

## 2020-08-27 ENCOUNTER — Encounter
Admission: EM | Disposition: A | Discharge status: Home / Self Care | Payer: Self-pay | Source: Home / Self Care | Attending: Emergency Medicine

## 2020-08-27 ENCOUNTER — Ambulatory Visit
Admission: EM | Admit: 2020-08-27 | Discharge: 2020-08-27 | Disposition: A | Discharge status: Home / Self Care | Payer: BC Managed Care – PPO | Attending: Surgery | Admitting: Surgery

## 2020-08-27 DIAGNOSIS — Z419 Encounter for procedure for purposes other than remedying health state, unspecified: Secondary | ICD-10-CM

## 2020-08-27 DIAGNOSIS — Z408 Encounter for other prophylactic surgery: Secondary | ICD-10-CM | POA: Diagnosis not present

## 2020-08-27 DIAGNOSIS — J309 Allergic rhinitis, unspecified: Secondary | ICD-10-CM | POA: Diagnosis not present

## 2020-08-27 DIAGNOSIS — Z85828 Personal history of other malignant neoplasm of skin: Secondary | ICD-10-CM | POA: Insufficient documentation

## 2020-08-27 DIAGNOSIS — Z793 Long term (current) use of hormonal contraceptives: Secondary | ICD-10-CM | POA: Insufficient documentation

## 2020-08-27 DIAGNOSIS — Z79899 Other long term (current) drug therapy: Secondary | ICD-10-CM | POA: Diagnosis not present

## 2020-08-27 DIAGNOSIS — S52612A Displaced fracture of left ulna styloid process, initial encounter for closed fracture: Secondary | ICD-10-CM | POA: Insufficient documentation

## 2020-08-27 DIAGNOSIS — Z79890 Hormone replacement therapy (postmenopausal): Secondary | ICD-10-CM | POA: Insufficient documentation

## 2020-08-27 DIAGNOSIS — S52572A Other intraarticular fracture of lower end of left radius, initial encounter for closed fracture: Secondary | ICD-10-CM | POA: Diagnosis not present

## 2020-08-27 DIAGNOSIS — W19XXXA Unspecified fall, initial encounter: Secondary | ICD-10-CM

## 2020-08-27 DIAGNOSIS — S62102A Fracture of unspecified carpal bone, left wrist, initial encounter for closed fracture: Secondary | ICD-10-CM

## 2020-08-27 DIAGNOSIS — Z20822 Contact with and (suspected) exposure to covid-19: Secondary | ICD-10-CM | POA: Insufficient documentation

## 2020-08-27 DIAGNOSIS — W010XXA Fall on same level from slipping, tripping and stumbling without subsequent striking against object, initial encounter: Secondary | ICD-10-CM | POA: Diagnosis not present

## 2020-08-27 DIAGNOSIS — S82002A Unspecified fracture of left patella, initial encounter for closed fracture: Secondary | ICD-10-CM | POA: Diagnosis present

## 2020-08-27 HISTORY — PX: ORIF WRIST FRACTURE: SHX2133

## 2020-08-27 HISTORY — PX: ORIF PATELLA: SHX5033

## 2020-08-27 LAB — CBC WITH DIFFERENTIAL/PLATELET
Abs Immature Granulocytes: 0.04 10*3/uL (ref 0.00–0.07)
Basophils Absolute: 0 10*3/uL (ref 0.0–0.1)
Basophils Relative: 0 %
Eosinophils Absolute: 0 10*3/uL (ref 0.0–0.5)
Eosinophils Relative: 0 %
HCT: 41.9 % (ref 36.0–46.0)
Hemoglobin: 13.9 g/dL (ref 12.0–15.0)
Immature Granulocytes: 0 %
Lymphocytes Relative: 9 %
Lymphs Abs: 1 10*3/uL (ref 0.7–4.0)
MCH: 30.6 pg (ref 26.0–34.0)
MCHC: 33.2 g/dL (ref 30.0–36.0)
MCV: 92.3 fL (ref 80.0–100.0)
Monocytes Absolute: 0.6 10*3/uL (ref 0.1–1.0)
Monocytes Relative: 5 %
Neutro Abs: 10.1 10*3/uL — ABNORMAL HIGH (ref 1.7–7.7)
Neutrophils Relative %: 86 %
Platelets: 295 10*3/uL (ref 150–400)
RBC: 4.54 MIL/uL (ref 3.87–5.11)
RDW: 12.5 % (ref 11.5–15.5)
WBC: 11.8 10*3/uL — ABNORMAL HIGH (ref 4.0–10.5)
nRBC: 0 % (ref 0.0–0.2)

## 2020-08-27 LAB — BASIC METABOLIC PANEL WITH GFR
Anion gap: 10 (ref 5–15)
BUN: 13 mg/dL (ref 6–20)
CO2: 23 mmol/L (ref 22–32)
Calcium: 8.7 mg/dL — ABNORMAL LOW (ref 8.9–10.3)
Chloride: 105 mmol/L (ref 98–111)
Creatinine, Ser: 0.7 mg/dL (ref 0.44–1.00)
GFR calc Af Amer: >60 mL/min
GFR calc non Af Amer: >60 mL/min
Glucose, Bld: 104 mg/dL — ABNORMAL HIGH (ref 70–99)
Potassium: 3.9 mmol/L (ref 3.5–5.1)
Sodium: 138 mmol/L (ref 135–145)

## 2020-08-27 LAB — SARS CORONAVIRUS 2 BY RT PCR (HOSPITAL ORDER, PERFORMED IN ~~LOC~~ HOSPITAL LAB): SARS Coronavirus 2: NEGATIVE

## 2020-08-27 SURGERY — OPEN REDUCTION INTERNAL FIXATION (ORIF) PATELLA
Anesthesia: General | Site: Wrist | Laterality: Left

## 2020-08-27 MED ORDER — GLYCOPYRROLATE 0.2 MG/ML IJ SOLN
INTRAMUSCULAR | Status: DC | PRN
  Administered 2020-08-27: .2 mg via INTRAVENOUS

## 2020-08-27 MED ORDER — KETOROLAC TROMETHAMINE 60 MG/2ML IM SOLN
60.0000 mg | Freq: Once | INTRAMUSCULAR | Status: AC
  Administered 2020-08-27: 60 mg via INTRAMUSCULAR
  Filled 2020-08-27: qty 2

## 2020-08-27 MED ORDER — ONDANSETRON HCL 4 MG PO TABS: 4.0000 mg | ORAL_TABLET | Freq: Four times a day (QID) | ORAL | Status: DC | PRN

## 2020-08-27 MED ORDER — OXYCODONE HCL 5 MG PO TABS: 5.0000 mg | ORAL_TABLET | ORAL | Status: DC | PRN

## 2020-08-27 MED ORDER — FENTANYL CITRATE (PF) 100 MCG/2ML IJ SOLN
INTRAMUSCULAR | Status: AC
  Filled 2020-08-27: qty 2

## 2020-08-27 MED ORDER — PROPOFOL 10 MG/ML IV BOLUS
INTRAVENOUS | Status: AC
  Filled 2020-08-27: qty 20

## 2020-08-27 MED ORDER — LIDOCAINE HCL (CARDIAC) PF 100 MG/5ML IV SOSY
PREFILLED_SYRINGE | INTRAVENOUS | Status: DC | PRN
  Administered 2020-08-27: 100 mg via INTRAVENOUS

## 2020-08-27 MED ORDER — FENTANYL CITRATE (PF) 100 MCG/2ML IJ SOLN: 25.0000 ug | INTRAMUSCULAR | Status: DC | PRN

## 2020-08-27 MED ORDER — BUPIVACAINE HCL (PF) 0.5 % IJ SOLN
INTRAMUSCULAR | Status: DC | PRN
  Administered 2020-08-27: 15 mL

## 2020-08-27 MED ORDER — OXYCODONE HCL 5 MG PO TABS: 5.0000 mg | ORAL_TABLET | ORAL | 0 refills | Status: DC | PRN

## 2020-08-27 MED ORDER — ONDANSETRON HCL 4 MG/2ML IJ SOLN: 4.0000 mg | Freq: Four times a day (QID) | INTRAMUSCULAR | Status: DC | PRN

## 2020-08-27 MED ORDER — POTASSIUM CHLORIDE IN NACL 20-0.9 MEQ/L-% IV SOLN: INTRAVENOUS | Status: DC

## 2020-08-27 MED ORDER — LACTATED RINGERS IV SOLN: INTRAVENOUS | Status: DC | PRN

## 2020-08-27 MED ORDER — EPINEPHRINE PF 1 MG/ML IJ SOLN
INTRAMUSCULAR | Status: DC | PRN
  Administered 2020-08-27: .15 mL

## 2020-08-27 MED ORDER — MIDAZOLAM HCL 2 MG/2ML IJ SOLN
INTRAMUSCULAR | Status: DC | PRN
  Administered 2020-08-27 (×2): 1 mg via INTRAVENOUS

## 2020-08-27 MED ORDER — FENTANYL CITRATE (PF) 100 MCG/2ML IJ SOLN
INTRAMUSCULAR | Status: DC | PRN
Start: 2020-08-27 — End: 2020-08-27
  Administered 2020-08-27 (×3): 25 ug via INTRAVENOUS
  Administered 2020-08-27: 50 ug via INTRAVENOUS
  Administered 2020-08-27: 25 ug via INTRAVENOUS
  Administered 2020-08-27: 50 ug via INTRAVENOUS

## 2020-08-27 MED ORDER — METOCLOPRAMIDE HCL 10 MG PO TABS: 5.0000 mg | ORAL_TABLET | Freq: Three times a day (TID) | ORAL | Status: DC | PRN

## 2020-08-27 MED ORDER — PROPOFOL 10 MG/ML IV BOLUS
INTRAVENOUS | Status: DC | PRN
  Administered 2020-08-27: 150 mg via INTRAVENOUS

## 2020-08-27 MED ORDER — BUPIVACAINE HCL (PF) 0.5 % IJ SOLN
INTRAMUSCULAR | Status: AC
  Filled 2020-08-27: qty 30

## 2020-08-27 MED ORDER — ONDANSETRON HCL 4 MG/2ML IJ SOLN
4.0000 mg | Freq: Once | INTRAMUSCULAR | Status: AC
  Administered 2020-08-27: 4 mg via INTRAVENOUS
  Filled 2020-08-27: qty 2

## 2020-08-27 MED ORDER — CEFAZOLIN SODIUM-DEXTROSE 2-3 GM-%(50ML) IV SOLR
INTRAVENOUS | Status: DC | PRN
  Administered 2020-08-27: 2 g via INTRAVENOUS

## 2020-08-27 MED ORDER — HYDROMORPHONE HCL 1 MG/ML IJ SOLN
1.0000 mg | Freq: Once | INTRAMUSCULAR | Status: AC
  Administered 2020-08-27: 1 mg via INTRAMUSCULAR
  Filled 2020-08-27: qty 1

## 2020-08-27 MED ORDER — DEXAMETHASONE SODIUM PHOSPHATE 10 MG/ML IJ SOLN
INTRAMUSCULAR | Status: DC | PRN
  Administered 2020-08-27: 10 mg via INTRAVENOUS

## 2020-08-27 MED ORDER — ONDANSETRON HCL 4 MG/2ML IJ SOLN
INTRAMUSCULAR | Status: DC | PRN
  Administered 2020-08-27: 4 mg via INTRAVENOUS

## 2020-08-27 MED ORDER — MIDAZOLAM HCL 2 MG/2ML IJ SOLN
INTRAMUSCULAR | Status: AC
  Filled 2020-08-27: qty 2

## 2020-08-27 MED ORDER — EPINEPHRINE PF 1 MG/ML IJ SOLN
INTRAMUSCULAR | Status: AC
  Filled 2020-08-27: qty 1

## 2020-08-27 MED ORDER — ONDANSETRON HCL 4 MG/2ML IJ SOLN: 4.0000 mg | Freq: Once | INTRAMUSCULAR | Status: DC | PRN

## 2020-08-27 MED ORDER — METOCLOPRAMIDE HCL 5 MG/ML IJ SOLN: 5.0000 mg | Freq: Three times a day (TID) | INTRAMUSCULAR | Status: DC | PRN

## 2020-08-27 MED ORDER — DEXMEDETOMIDINE (PRECEDEX) IN NS 20 MCG/5ML (4 MCG/ML) IV SYRINGE
PREFILLED_SYRINGE | INTRAVENOUS | Status: DC | PRN
  Administered 2020-08-27: 8 ug via INTRAVENOUS
  Administered 2020-08-27: 12 ug via INTRAVENOUS

## 2020-08-27 SURGICAL SUPPLY — 112 items
BENZOIN TINCTURE PRP APPL 2/3 (GAUZE/BANDAGES/DRESSINGS) ×4 IMPLANT
BIT DRILL 135X2XAO FIT SCL (BIT) ×4 IMPLANT
BIT DRILL 2.0 (BIT) ×8
BIT DRILL 2.2 SS TIBIAL (BIT) ×4 IMPLANT
BIT DRILL 2.5X110 QC LCP DISP (BIT) IMPLANT
BIT DRL 135X2XAO FIT SCL (BIT) ×4
BLADE SURG SZ10 CARB STEEL (BLADE) ×8 IMPLANT
BNDG COHESIVE 4X5 TAN STRL (GAUZE/BANDAGES/DRESSINGS) ×4 IMPLANT
BNDG ELASTIC 3X5.8 VLCR STR LF (GAUZE/BANDAGES/DRESSINGS) ×4 IMPLANT
BNDG ELASTIC 4X5.8 VLCR STR LF (GAUZE/BANDAGES/DRESSINGS) ×4 IMPLANT
BNDG ELASTIC 6X5.8 VLCR NS LF (GAUZE/BANDAGES/DRESSINGS) ×4 IMPLANT
BNDG ELASTIC 6X5.8 VLCR STR LF (GAUZE/BANDAGES/DRESSINGS) ×4 IMPLANT
BNDG ESMARK 4X12 TAN STRL LF (GAUZE/BANDAGES/DRESSINGS) ×4 IMPLANT
BNDG ESMARK 6X12 TAN STRL LF (GAUZE/BANDAGES/DRESSINGS) ×4 IMPLANT
BRACE KNEE POST OP SHORT (BRACE) ×4 IMPLANT
CABLE PIN IMPLANT 35 (Cable) ×3 IMPLANT
CABLE PIN IMPLANT 35MM (Cable) ×1 IMPLANT
CANISTER SUCT 1200ML W/VALVE (MISCELLANEOUS) ×4 IMPLANT
CAST PADDING 3X4FT ST 30246 (SOFTGOODS) ×2
CHLORAPREP W/TINT 26 (MISCELLANEOUS) ×12 IMPLANT
CLOSURE WOUND 1/4X4 (GAUZE/BANDAGES/DRESSINGS) ×1
CORD BIP STRL DISP 12FT (MISCELLANEOUS) ×4 IMPLANT
COVER WAND RF STERILE (DRAPES) ×8 IMPLANT
CUFF TOURN 24 STER (MISCELLANEOUS) ×4 IMPLANT
CUFF TOURN 30 STER DUAL PORT (MISCELLANEOUS) IMPLANT
CUFF TOURN SGL QUICK 18X4 (TOURNIQUET CUFF) ×4 IMPLANT
DECANTER SPIKE VIAL GLASS SM (MISCELLANEOUS) ×4 IMPLANT
DRAPE C-ARM XRAY 36X54 (DRAPES) ×4 IMPLANT
DRAPE C-ARMOR (DRAPES) ×4 IMPLANT
DRAPE FLUOR MINI C-ARM 54X84 (DRAPES) ×8 IMPLANT
DRAPE INCISE IOBAN 66X45 STRL (DRAPES) IMPLANT
DRAPE SPLIT 6X30 W/TAPE (DRAPES) ×4 IMPLANT
DRAPE SURG 17X11 SM STRL (DRAPES) ×4 IMPLANT
DRAPE U-SHAPE 47X51 STRL (DRAPES) ×4 IMPLANT
DRSG OPSITE POSTOP 4X6 (GAUZE/BANDAGES/DRESSINGS) ×4 IMPLANT
ELECT CAUTERY BLADE 6.4 (BLADE) ×4 IMPLANT
ELECT REM PT RETURN 9FT ADLT (ELECTROSURGICAL) ×4
ELECTRODE REM PT RTRN 9FT ADLT (ELECTROSURGICAL) ×2 IMPLANT
FORCEPS JEWEL BIP 4-3/4 STR (INSTRUMENTS) ×4 IMPLANT
GAUZE SPONGE 4X4 12PLY STRL (GAUZE/BANDAGES/DRESSINGS) ×4 IMPLANT
GAUZE XEROFORM 1X8 LF (GAUZE/BANDAGES/DRESSINGS) ×4 IMPLANT
GLOVE BIO SURGEON STRL SZ8 (GLOVE) ×24 IMPLANT
GLOVE INDICATOR 8.0 STRL GRN (GLOVE) ×8 IMPLANT
GOWN SRG 2XL LVL 4 RGLN SLV (GOWNS) ×8 IMPLANT
GOWN STRL NON-REIN 2XL LVL4 (GOWNS) ×16
GOWN STRL REUS W/ TWL LRG LVL3 (GOWN DISPOSABLE) IMPLANT
GOWN STRL REUS W/ TWL XL LVL3 (GOWN DISPOSABLE) IMPLANT
GOWN STRL REUS W/TWL LRG LVL3 (GOWN DISPOSABLE)
GOWN STRL REUS W/TWL XL LVL3 (GOWN DISPOSABLE)
HANDLE YANKAUER SUCT BULB TIP (MISCELLANEOUS) ×4 IMPLANT
HEMOVAC 400CC 10FR (MISCELLANEOUS) IMPLANT
IMMOB KNEE 24 THIGH 24 443303 (SOFTGOODS) IMPLANT
K-WIRE .062 (WIRE)
K-WIRE 1.6 (WIRE) ×8
K-WIRE FX5X1.6XNS BN SS (WIRE) ×4
K-WIRE FX6X.062X2 END TROC (WIRE)
KIT TURNOVER KIT A (KITS) ×4 IMPLANT
KWIRE FX5X1.6XNS BN SS (WIRE) ×4 IMPLANT
KWIRE FX6X.062X2 END TROC (WIRE) IMPLANT
NEEDLE FILTER BLUNT 18X 1/2SAF (NEEDLE) ×4
NEEDLE FILTER BLUNT 18X1 1/2 (NEEDLE) ×4 IMPLANT
NEEDLE HYPO 22GX1.5 SAFETY (NEEDLE) ×4 IMPLANT
NS IRRIG 1000ML POUR BTL (IV SOLUTION) IMPLANT
NS IRRIG 500ML POUR BTL (IV SOLUTION) ×8 IMPLANT
PACK EXTREMITY (MISCELLANEOUS) ×8 IMPLANT
PAD ABD DERMACEA PRESS 5X9 (GAUZE/BANDAGES/DRESSINGS) IMPLANT
PAD CAST CTTN 3X4 STRL (SOFTGOODS) ×2 IMPLANT
PAD CAST CTTN 4X4 STRL (SOFTGOODS) IMPLANT
PAD WRAPON POLAR KNEE (MISCELLANEOUS) ×2 IMPLANT
PADDING CAST 4IN STRL (MISCELLANEOUS)
PADDING CAST BLEND 4X4 STRL (MISCELLANEOUS) IMPLANT
PADDING CAST COTTON 3X4 STRL (SOFTGOODS) ×2
PADDING CAST COTTON 4X4 STRL (SOFTGOODS)
PEG LOCKING SMOOTH 2.2X16 (Screw) ×12 IMPLANT
PEG LOCKING SMOOTH 2.2X18 (Peg) ×16 IMPLANT
PLATE STANDARD DVR LEFT (Plate) ×4 IMPLANT
PLATE STD DVR LT 24X51 (Plate) ×2 IMPLANT
POSITIONER TRIANGLE 14IN (MISCELLANEOUS) ×4 IMPLANT
SCREW  LP NL 2.7X16MM (Screw) ×4 IMPLANT
SCREW 2.7X14MM (Screw) ×2 IMPLANT
SCREW BN 14X2.7XNONLOCK 3 LD (Screw) ×2 IMPLANT
SCREW LOCKING 2.7X13MM (Screw) ×4 IMPLANT
SCREW LP NL 2.7X16MM (Screw) ×2 IMPLANT
SCREW NLOCK 2.7X14 (Screw) ×2 IMPLANT
SLING ARM M TX990204 (SOFTGOODS) ×4 IMPLANT
SPLINT CAST 1 STEP 3X12 (MISCELLANEOUS) IMPLANT
SPLINT CAST 1 STEP 4X15 (MISCELLANEOUS) ×4 IMPLANT
SPONGE LAP 18X18 RF (DISPOSABLE) ×4 IMPLANT
STAPLER SKIN PROX 35W (STAPLE) ×4 IMPLANT
STOCKINETTE IMPERVIOUS 9X36 MD (GAUZE/BANDAGES/DRESSINGS) ×4 IMPLANT
STOCKINETTE M/LG 89821 (MISCELLANEOUS) ×4 IMPLANT
STRIP CLOSURE SKIN 1/4X4 (GAUZE/BANDAGES/DRESSINGS) ×3 IMPLANT
SUT FIBERWIRE #5 38 CONV BLUE (SUTURE) ×4
SUT ORTHOCORD W/MULTIPK NDL (SUTURE) ×4 IMPLANT
SUT PROLENE 4 0 PS 2 18 (SUTURE) ×4 IMPLANT
SUT STEEL 7 (SUTURE) ×4 IMPLANT
SUT VIC AB 0 CT1 27 (SUTURE) ×4
SUT VIC AB 0 CT1 27XCR 8 STRN (SUTURE) ×2 IMPLANT
SUT VIC AB 0 CT1 36 (SUTURE) ×4 IMPLANT
SUT VIC AB 2-0 CT1 (SUTURE) ×4 IMPLANT
SUT VIC AB 2-0 CT1 27 (SUTURE) ×4
SUT VIC AB 2-0 CT1 TAPERPNT 27 (SUTURE) ×2 IMPLANT
SUT VIC AB 2-0 SH 27 (SUTURE) ×4
SUT VIC AB 2-0 SH 27XBRD (SUTURE) ×2 IMPLANT
SUT VIC AB 3-0 SH 27 (SUTURE) ×4
SUT VIC AB 3-0 SH 27X BRD (SUTURE) ×2 IMPLANT
SUTURE FIBERWR #5 38 CONV BLUE (SUTURE) ×2 IMPLANT
SWABSTK COMLB BENZOIN TINCTURE (MISCELLANEOUS) ×4 IMPLANT
SYR 10ML LL (SYRINGE) ×4 IMPLANT
SYR 30ML LL (SYRINGE) ×4 IMPLANT
SYR BULB EAR ULCER 2OZ BL STRL (SYRINGE) ×4 IMPLANT
WRAPON POLAR PAD KNEE (MISCELLANEOUS) ×4

## 2020-08-27 NOTE — ED Notes (Addendum)
Pt last ate/drank at 8 AM. Pt states she is menopausal/does not menstrate. Report gvn to OR RN.

## 2020-08-27 NOTE — ED Notes (Signed)
See triage note   States she caught her foot  [address] [address]  Injury to right wrist and left knee  Positive deformity  Noted to wrist  Good pulses

## 2020-08-27 NOTE — ED Provider Notes (Signed)
[facility] Emergency Department Provider Note   ____________________________________________   First MD Initiated Contact with Patient 08/27/20 1349     (approximate)  I have reviewed the triage vital signs and the nursing notes.   HISTORY  Chief Complaint Fall    HPI [name] is a 54 y.o. female presents with bilateral wrist pain left wrist pain secondary to a trip and fall.  No LOC or head injury.  Obvious deformity to the left wrist.  Patient states no loss of sensation to bilateral wrist.  Decreased range of motion with the left wrist secondary to deformity.  Patient also states anterior knee pain.  Rates overall pain is a 10/10.  Described pain as "achy".  No positive measure prior to arrival.         Past Medical History:  Diagnosis Date  . Cancer (HCC)    basal cell on lt leg  . Environmental allergies   . Motion sickness   . Wears contact lenses     Patient Active Problem List   Diagnosis Date Noted  . Allergic rhinitis 12/18/2017  . Hot flashes 07/09/2014  . Endometrial polyp 09/27/2013  . Excessive or frequent menstruation 08/15/2013    Past Surgical History:  Procedure Laterality Date  . CESAREAN SECTION  07/27/2011   Procedure: CESAREAN SECTION;  Surgeon: Mora Bellman, MD;  Location: [facility];  Service: Gynecology;  Laterality: N/A;  . COLONOSCOPY    . IMAGE GUIDED SINUS SURGERY N/A 06/05/2018   Procedure: IMAGE GUIDED SINUS SURGERY;  Surgeon: Margaretha Sheffield, MD;  Location: [place];  Service: ENT;  Laterality: N/A;  GAVE DISK TO BRENDA 4.4.19 DS gave 2nd disk to brenda 4.25.19  . MAXILLARY ANTROSTOMY Bilateral 06/05/2018   Procedure: MAXILLARY ANTROSTOMY NO TISSUE REMOVAL;  Surgeon: Margaretha Sheffield, MD;  Location: [place];  Service: ENT;  Laterality: Bilateral;  . SEPTOPLASTY N/A 06/05/2018   Procedure: SEPTOPLASTY;  Surgeon: Margaretha Sheffield, MD;  Location: [place];  Service: ENT;  Laterality:  N/A;  . TURBINATE REDUCTION Bilateral 06/05/2018   Procedure: BILATERAL INFERIOR TURBINATE REDUCTION / RIGHT MIDDLE [place];  Surgeon: Margaretha Sheffield, MD;  Location: [place];  Service: ENT;  Laterality: Bilateral;    Prior to Admission medications   Medication Sig Start Date End Date Taking? Authorizing Provider  cetirizine (ZYRTEC) 10 MG tablet Take 10 mg by mouth daily.    [provider]  estradiol (ESTRACE) 0.5 MG tablet Take 1 tablet (0.5 mg total) by mouth daily. 01/19/20   Caren Macadam, MD  estradiol-levonorgestrel [facility]) 0.045-0.015 MG/DAY Place 1 patch onto the skin once a week. 12/16/19   Emily Filbert, MD  estrogens, conjugated, (PREMARIN) 0.625 MG tablet Take daily 12/19/19   Emily Filbert, MD  fluticasone (FLONASE) 50 MCG/ACT nasal spray Place 2 sprays into both nostrils daily. 09/10/17   Donnamae Jude, MD  levonorgestrel (MIRENA) 20 MCG/24HR IUD 1 each by Intrauterine route once.    [provider]  levothyroxine (SYNTHROID) 50 MCG tablet Take 1 tablet (50 mcg total) by mouth daily before breakfast. 12/17/19   Emily Filbert, MD  medroxyPROGESTERone (PROVERA) 2.5 MG tablet Take 1 tablet (2.5 mg total) by mouth daily. 12/19/19   Emily Filbert, MD  montelukast (SINGULAIR) 10 MG tablet Take 1 tablet (10 mg total) by mouth at bedtime. 02/14/17   Donnamae Jude, MD  pseudoephedrine (SUDAFED) 30 MG tablet Take 30 mg by mouth every 6 (six)  hours as needed for congestion.    [provider]    Allergies Scallops [shellfish allergy]  Family History  Problem Relation Age of Onset  . Stroke Father   . Hypertension Father   . Thyroid disease Father   . Thyroid disease Brother   . Thyroid disease Sister   . Breast cancer Neg Hx     Social History Social History   Tobacco Use  . Smoking status: Never Smoker  . Smokeless tobacco: Never Used  Vaping Use  . Vaping Use: Never used  Substance Use Topics  . Alcohol use: Yes     Alcohol/week: 4.0 standard drinks    Types: 4 Glasses of wine per week    Comment: ocssonaly  . Drug use: No    Review of Systems Constitutional: No fever/chills Eyes: No visual changes. ENT: No sore throat. Cardiovascular: Denies chest pain. Respiratory: Denies shortness of breath. Gastrointestinal: No abdominal pain.  No nausea, no vomiting.  No diarrhea.  No constipation. Genitourinary: Negative for dysuria. Musculoskeletal: Bilateral wrist pain and left knee pain. Skin: Negative for rash. Neurological: Negative for headaches, focal weakness or numbness. Endocrine:  Hypothyroidism. Allergic/Immunilogical: Shellfish.  ____________________________________________   PHYSICAL EXAM:  VITAL SIGNS: ED Triage Vitals  Enc Vitals Group     BP 08/27/20 1247 (!) 148/96     Pulse Rate 08/27/20 1247 88     Resp 08/27/20 1247 18     Temp 08/27/20 1247 98.5 F (36.9 C)     Temp Source 08/27/20 1247 Oral     SpO2 08/27/20 1247 100 %     Weight 08/27/20 1247 157 lb (71.2 kg)     Height 08/27/20 1247 5\' 9"  (1.753 m)     Head Circumference --      Peak Flow --      Pain Score 08/27/20 1308 10     Pain Loc --      Pain Edu? --      Excl. in [place]? --    Constitutional: Alert and oriented. Well appearing and in no acute distress. Neck: No stridor. Hematological/Lymphatic/Immunilogical: No cervical lymphadenopathy. Cardiovascular: Normal rate, regular rhythm. Grossly normal heart sounds.  Good peripheral circulation. Respiratory: Normal respiratory effort.  No retractions. Lungs CTAB. Gastrointestinal: Soft and nontender. No distention. No abdominal bruits. No CVA tenderness. Genitourinary: Deferred Musculoskeletal: Obvious deformity to left wrist.  Mild edema on ulnar aspect of right wrist.  No obvious knee deformity.  Patient is moderate guarding palpation anterior patella.  Decreased range of motion with flexion limited by complaint of pain. Neurologic:  Normal speech and language. No  gross focal neurologic deficits are appreciated. No gait instability. Skin:  Skin is warm, dry and intact. No rash noted.  No abrasion or ecchymosis. Psychiatric: Mood and affect are normal. Speech and behavior are normal.  ____________________________________________   LABS (all labs ordered are listed, but only abnormal results are displayed)  Labs Reviewed  SARS CORONAVIRUS 2 BY RT PCR (HOSPITAL ORDER, [place] [facility])  CBC WITH DIFFERENTIAL/PLATELET  BASIC METABOLIC PANEL   ____________________________________________  EKG   ____________________________________________  RADIOLOGY  ED MD interpretation:    Official radiology report(s): DG Wrist Complete Left  Result Date: 08/27/2020 CLINICAL DATA:  Tripped and fell today. Bilateral wrist and left knee pain. Left wrist deformity. EXAM: LEFT WRIST - COMPLETE 4 VIEW COMPARISON:  None. FINDINGS: Comminuted fracture of the distal radius. Fracture lines extend to the distal radial articular surface. Distal fracture components have  displaced in a volar/anterior direction by 1.5 cm, accompanied by the carpus. Fractures also impacted by at least 8 mm, leading to an ulnar positive variance. There is a mildly displaced fracture across the base of the ulnar styloid. No dislocation. Diffuse surrounding soft tissue swelling. IMPRESSION: 1. Comminuted, impacted and displaced fracture of the distal left radius, with intra-articular components. Fracture of the ulnar styloid. No dislocation. Electronically Signed   By: Lajean Manes M.D.   On: 08/27/2020 14:27   DG Wrist Complete Right  Result Date: 08/27/2020 CLINICAL DATA:  [place] [address] today.  Bilateral wrist and left knee pain. EXAM: RIGHT WRIST - COMPLETE 4 VIEW COMPARISON:  None. FINDINGS: No fracture.  No bone lesion. Mild joint space narrowing with small marginal osteophytes at the trapezium first metacarpal articulation consistent with osteoarthritis. Remaining joints normally  spaced and aligned. Soft tissues are unremarkable. IMPRESSION: No fracture or dislocation. Electronically Signed   By: Lajean Manes M.D.   On: 08/27/2020 14:29   DG Knee 2 Views Left  Result Date: 08/27/2020 CLINICAL DATA:  Fall today.  Bilateral wrist and left knee pain. EXAM: LEFT KNEE - 3 VIEW COMPARISON:  None. FINDINGS: Acute transverse nondisplaced and non comminuted fracture of the mid patella. No other fractures.  Knee joint is normally spaced and aligned. Small to moderate joint effusion distends the suprapatellar joint capsule. IMPRESSION: 1. Nondisplaced transverse fracture of the mid patella. No other fractures. No dislocation. Electronically Signed   By: Lajean Manes M.D.   On: 08/27/2020 14:30    ____________________________________________   PROCEDURES  Procedure(s) performed (including Critical Care):  .Splint Application  Date/Time: 08/27/2020 3:21 PM Performed by: Sable Feil, PA-C Authorized by: Sable Feil, PA-C   Consent:    Consent obtained:  Verbal   Consent given by:  Patient   Risks discussed:  Numbness, pain and swelling Pre-procedure details:    Sensation:  Normal Procedure details:    Laterality:  Left   Location:  Wrist   Wrist:  L wrist   Strapping: no     Splint type:  Volar short arm Post-procedure details:    Pain:  Unchanged   Sensation:  Normal   Patient tolerance of procedure:  Tolerated well, no immediate complications .Splint Application  Date/Time: 08/27/2020 3:22 PM Performed by: Sable Feil, PA-C Authorized by: Sable Feil, PA-C   Consent:    Consent obtained:  Verbal   Consent given by:  Patient   Risks discussed:  Numbness, pain and swelling Pre-procedure details:    Sensation:  Normal Procedure details:    Laterality:  Left   Location:  Knee   Knee:  L knee   Splint type:  Knee immobilizer Post-procedure details:    Pain:  Unchanged   Sensation:  Normal   Patient tolerance of procedure:  Tolerated  well, no immediate complications     ____________________________________________   INITIAL IMPRESSION / ASSESSMENT AND PLAN / ED COURSE  As part of my medical decision making, I reviewed the following data within the [place]         Patient complains of bilateral wrist pain and left knee pain.  Discussed x-ray findings with patient showing a displaced fracture of the distal left radius and a nondisplaced fracture of the left patella.  No fractures observed of the right wrist.  Discussed with on-call orthopedics who had patient admitted for reduction of fracture.  Patient placed in ulnar OCL splint and knee immobilizer.  ____________________________________________   FINAL CLINICAL IMPRESSION(S) / ED DIAGNOSES  Final diagnoses:  Left wrist fracture, closed, initial encounter  Closed nondisplaced fracture of left patella, unspecified fracture morphology, initial encounter     ED Discharge Orders    None      *Please note:  Rebecca Roman was evaluated in Emergency Department on 08/27/2020 for the symptoms described in the history of present illness. She was evaluated in the context of the global COVID-19 pandemic, which necessitated consideration that the patient might be at risk for infection with the SARS-CoV-2 virus that causes COVID-19. Institutional protocols and algorithms that pertain to the evaluation of patients at risk for COVID-19 are in a state of rapid change based on information released by regulatory bodies including the CDC and federal and state organizations. These policies and algorithms were followed during the patient's care in the ED.  Some ED evaluations and interventions may be delayed as a result of limited staffing during and the pandemic.*   Note:  This document was prepared using Dragon voice recognition software and may include unintentional dictation errors.    Sable Feil, PA-C 08/27/20 1523    Nena Polio,  MD 08/27/20 1536

## 2020-08-27 NOTE — H&P (Signed)
Subjective:  Chief complaint: Left wrist and left knee pain.  The patient is a 54 y.o. Roman who sustained an injury to the left wrist and left knee earlier today when she tripped and fell on the sidewalk heading to a vet appointment, causing her to fall forward onto her outstretched left hand and flexed left knee. The patient denies any other associated injury, other than a superficial abrasion on the palmar aspect of her right hand. The patient did not strike her head or lose consciousness. The patient also denies any light-headedness, dizziness, chest pain, or shortness of breath which might have contributed to the injury.  X-rays in the emergency room demonstrated a badly displaced intra-articular fracture of the left distal radius, as well as a nondisplaced left patella fracture.  Given the presence of both of these injuries, is felt best to stabilize these injuries prior to discharging her home.  Patient Active Problem List   Diagnosis Date Noted  . Allergic rhinitis 12/18/2017  . Hot flashes 07/09/2014  . Endometrial polyp 09/27/2013  . Excessive or frequent menstruation 08/15/2013   Past Medical History:  Diagnosis Date  . Cancer (HCC)    basal cell on lt leg  . Environmental allergies   . Motion sickness   . Wears contact lenses     Past Surgical History:  Procedure Laterality Date  . CESAREAN SECTION  07/27/2011   Procedure: CESAREAN SECTION;  Surgeon: Mora Bellman, MD;  Location: [facility];  Service: Gynecology;  Laterality: N/A;  . COLONOSCOPY    . IMAGE GUIDED SINUS SURGERY N/A 06/05/2018   Procedure: IMAGE GUIDED SINUS SURGERY;  Surgeon: Margaretha Sheffield, MD;  Location: [facility];  Service: ENT;  Laterality: N/A;  GAVE DISK TO BRENDA 4.4.19 DS gave 2nd disk to brenda 4.Rebecca.19  . MAXILLARY ANTROSTOMY Bilateral 06/05/2018   Procedure: MAXILLARY ANTROSTOMY NO TISSUE REMOVAL;  Surgeon: Margaretha Sheffield, MD;  Location: [facility];  Service: ENT;  Laterality: Bilateral;   . SEPTOPLASTY N/A 06/05/2018   Procedure: SEPTOPLASTY;  Surgeon: Margaretha Sheffield, MD;  Location: [facility];  Service: ENT;  Laterality: N/A;  . TURBINATE REDUCTION Bilateral 06/05/2018   Procedure: BILATERAL INFERIOR TURBINATE REDUCTION / RIGHT MIDDLE [place];  Surgeon: Margaretha Sheffield, MD;  Location: [facility];  Service: ENT;  Laterality: Bilateral;    (Not in a hospital admission)  Allergies  Allergen Reactions  . Scallops [Shellfish Allergy] Nausea And Vomiting    Only scallops, not any other seafood    Social History   Tobacco Use  . Smoking status: Never Smoker  . Smokeless tobacco: Never Used  Substance Use Topics  . Alcohol use: Yes    Alcohol/week: 4.0 standard drinks    Types: 4 Glasses of wine per week    Comment: ocssonaly    Family History  Problem Relation Age of Onset  . Stroke Father   . Hypertension Father   . Thyroid disease Father   . Thyroid disease Brother   . Thyroid disease Sister   . Breast cancer Neg Hx      Review of Systems: As noted above. The patient denies any chest pain, shortness of breath, nausea, vomiting, diarrhea, constipation, belly pain, blood in his/her stool, or burning with urination.  Objective: Temp:  [98.5 F (36.9 C)] 98.5 F (36.9 C) (09/17 1247) Pulse Rate:  [76-88] 76 (09/17 1600) Resp:  [18] 18 (09/17 1247) BP: (137-149)/(79-119) 144/119 (09/17 1600) SpO2:  [96 %-100 %] 98 % (09/17 1600) Weight:  [  71.2 kg] 71.2 kg (09/17 1247)  Physical Exam: General:  Alert, no acute distress Psychiatric:  Patient is competent for consent with normal mood and affect Cardiovascular:  RRR  Respiratory:  Clear to auscultation. No wheezing. Non-labored breathing GI:  Abdomen is soft and non-tender Skin:  No lesions in the area of chief complaint Neurologic:  Sensation intact distally Lymphatic:  No axillary or cervical lymphadenopathy  Orthopedic Exam:  Orthopedic examination is limited to the left wrist  and hand.  There is an obvious deformity of the left wrist with moderate swelling noted.  She has pain with any attempted active or passive motion of the wrist.  She also has moderate tenderness to palpation over the wrist.  She is able to flex and extend her fingers, although range of motion is limited secondary to pain and apprehension.  She is neurovascularly intact to all digits.  Orthopedic examination of the left knee demonstrates several small superficial abrasions over the prepatellar region.  Moderate swelling is noted, as well as a small effusion.  No erythema, ecchymosis, abrasions, or other skin abnormalities are identified.  Range of motion is limited secondary to pain.  She is neurovascularly intact to the left lower extremity and foot.  Imaging Review: Recent x-rays of the left wrist are available for review and have been reviewed by myself.  These films demonstrate a comminuted intra-articular fracture of the left distal radius with volar displacement of the fracture fragments.  No significant degenerative changes or lytic lesions are identified.  Recent x-rays of the left knee are available for review and have been reviewed by myself.  These films demonstrate a an essentially nondisplaced transverse fracture of the left patella.  No significant degenerative changes or lytic lesions are identified.  Assessment: 1.  Closed nondisplaced transverse fracture of left patella. 2.  Closed displaced intra-articular comminuted fracture of left distal radius.  Plan: The treatment options, including both surgical and nonsurgical choices, have been discussed in detail with the patient and her husband. The risks (including bleeding, infection, nerve and/or blood vessel injury, persistent or recurrent pain, loosening or failure of the components, malunion and/or nonunion,, joint stiffness, development of arthritis, need for further surgery, blood clots, strokes, heart attacks or arrhythmias,  pneumonia, etc.) and benefits of the surgical procedure were discussed. The patient states his/her understanding and agrees to proceed. She agrees to a blood transfusion if necessary. A formal written consent will be obtained by the nursing staff.

## 2020-08-27 NOTE — Anesthesia Preprocedure Evaluation (Signed)
Anesthesia Evaluation  Patient identified by MRN, date of birth, ID band Patient awake    Reviewed: Allergy & Precautions, H&P , NPO status , Patient's Chart, lab work & pertinent test results, reviewed documented beta blocker date and time   Airway Mallampati: II  TM Distance: >3 FB Neck ROM: full    Dental  (+) Teeth Intact   Pulmonary neg pulmonary ROS,    Pulmonary exam normal        Cardiovascular negative cardio ROS Normal cardiovascular exam Rhythm:regular Rate:Normal     Neuro/Psych negative neurological ROS  negative psych ROS   GI/Hepatic negative GI ROS, Neg liver ROS,   Endo/Other  negative endocrine ROS  Renal/GU negative Renal ROS  negative genitourinary   Musculoskeletal   Abdominal   Peds  Hematology negative hematology ROS (+)   Anesthesia Other Findings Past Medical History: No date: Cancer ([place])     Comment:  basal cell on lt leg No date: Environmental allergies No date: Motion sickness No date: Wears contact lenses Past Surgical History: 07/27/2011: CESAREAN SECTION     Comment:  Procedure: CESAREAN SECTION;  Surgeon: Mora Bellman,               MD;  Location: [facility];  Service: Gynecology;  Laterality:              N/A; No date: COLONOSCOPY 06/05/2018: IMAGE GUIDED SINUS SURGERY; N/A     Comment:  Procedure: IMAGE GUIDED SINUS SURGERY;  Surgeon:               Margaretha Sheffield, MD;  Location: [facility];                Service: ENT;  Laterality: N/A;  GAVE DISK TO BRENDA               4.4.19 DS gave 2nd disk to brenda 4.25.19 06/05/2018: MAXILLARY ANTROSTOMY; Bilateral     Comment:  Procedure: MAXILLARY ANTROSTOMY NO TISSUE REMOVAL;                Surgeon: Margaretha Sheffield, MD;  Location: [facility];  Service: ENT;  Laterality: Bilateral; 06/05/2018: SEPTOPLASTY; N/A     Comment:  Procedure: SEPTOPLASTY;  Surgeon: Margaretha Sheffield, MD;                Location:  [facility];  Service: ENT;                Laterality: N/A; 06/05/2018: TURBINATE REDUCTION; Bilateral     Comment:  Procedure: BILATERAL INFERIOR TURBINATE REDUCTION /               RIGHT MIDDLE [place];  Surgeon: Margaretha Sheffield, MD;  Location: [facility];  Service: ENT;               Laterality: Bilateral; BMI    Body Mass Index: 23.18 kg/m     Reproductive/Obstetrics negative OB ROS                             Anesthesia Physical Anesthesia Plan  ASA: II  Anesthesia Plan: General ETT   Post-op Pain Management:    Induction:   PONV Risk Score and Plan: 4 or greater  Airway Management Planned:  Additional Equipment:   Intra-op Plan:   Post-operative Plan:   Informed Consent: I have reviewed the patients History and Physical, chart, labs and discussed the procedure including the risks, benefits and alternatives for the proposed anesthesia with the patient or authorized representative who has indicated his/her understanding and acceptance.     Dental Advisory Given  Plan Discussed with: CRNA  Anesthesia Plan Comments:         Anesthesia Quick Evaluation

## 2020-08-27 NOTE — Anesthesia Procedure Notes (Signed)
Procedure Name: LMA Insertion Date/Time: 08/27/2020 5:55 PM Performed by: Allean Found, CRNA Pre-anesthesia Checklist: Patient identified, Patient being monitored, Timeout performed, Emergency Drugs available and Suction available Patient Re-evaluated:Patient Re-evaluated prior to induction Oxygen Delivery Method: Circle system utilized Preoxygenation: Pre-oxygenation with 100% oxygen Induction Type: IV induction Ventilation: Mask ventilation without difficulty LMA: LMA inserted LMA Size: 4.0 Tube type: Oral Number of attempts: 1 Placement Confirmation: positive ETCO2 and breath sounds checked- equal and bilateral Tube secured with: Tape Dental Injury: Teeth and Oropharynx as per pre-operative assessment

## 2020-08-27 NOTE — ED Triage Notes (Signed)
Pt was walking and tripped and injured her bilateral wrist and left knee. Pt NAD in triage.

## 2020-08-27 NOTE — ED Notes (Signed)
Pt in x-ray at this time

## 2020-08-27 NOTE — Op Note (Addendum)
08/27/2020  9:38 PM  Patient:   Rebecca Roman  Pre-Op Diagnosis:   1.  Nondisplaced transverse left patella fracture.      2.  Displaced comminuted intra-articular left distal radius fracture with ulnar styloid fracture.  Post-Op Diagnosis:   Same.  Procedure:   1.  Open reduction and internal fixation of nondisplaced transverse left patella fracture. 2.  Open reduction and internal fixation of comminuted intra-articular left distal radius fracture. 3.  Open left carpal tunnel release.  Surgeon:   Pascal Lux, MD  Assistant:   None  Anesthesia:   General LMA  Findings:   As above.  Complications:   None  EBL:   15 cc  Fluids:   900 cc crystalloid  TT:   49 minutes at 300 mmHg for the left knee and 103 minutes at 250 mm for the left wrist  Drains:   None  Closure:   Staples  Implants:   1.  Biomet 35 mm tension band screw and cable system. 2.  Biomet DVR Cross-locked narrow precontoured distal radius mini-locking plate.  Brief Clinical Note:   The patient is a 54 year old female who sustained the above-noted injuries earlier this afternoon when she tripped and fell onto her outstretched left hand and flexed right knee. She was brought to the emergency room where x-rays demonstrated the above-noted injuries. Given the difficulty with mobilization because of the left upper and left lower extremity involvement, it was felt best to proceed with surgical stabilization of both fractures. The patient presents at this time for definitive management of this injury.  Procedure:   The patient was brought into the operating room and lain in the supine position. After adequate general laryngeal mask anesthesia was obtained, it was elected to proceed with the left patella fracture first. The patient's left lower extremity was prepped with ChloraPrep solution before being draped sterilely. Preoperative antibiotics were administered. A timeout was performed to verify the appropriate  surgical site before the limb was exsanguinated with an Esmarch and the tourniquet inflated to 300 mmHg.   An approximately 3-4 inch incision was made over the anterior aspect of the knee, centered over the patella. The incision was carried down through the subcutaneous tissues to expose the superficial retinaculum. This was split the length of the incision and the medial and lateral flaps elevated sufficiently to expose the patella. The fracture fragments were carefully reduced and stabilized using bone holding forceps. The adequacy of fracture reduction was verified fluoroscopically in AP and lateral projections and found to be excellent. Two 1.8 mm drill bits were drilled from distal to proximal under fluoroscopic visualization. Once their position was verified fluoroscopically, each drill bit was removed and replaced with a 35 mm partially threaded cancellus screw with a cable attached. Again the adequacy of screw position was verified fluoroscopically in AP and lateral projections and found to be excellent. A horizontal hole was drilled through the proximal pole of the patella and one of the cables passed through this hole from lateral to medial. The cables were then tightened and secured in a figure-of-eight tension band configuration using the appropriate prep and crimping device. Again, the adequacy of fracture reduction and hardware position was verified fluoroscopically in AP and lateral projections and found to be satisfactory. The construct was stable to gentle knee flexion to 75.  The wound was copiously irrigated with sterile saline solution before the superficial retinacular layer was reapproximated using #0 Vicryl interrupted sutures. The subcutaneous tissues were closed in  two layers using 2-0 Vicryl interrupted sutures before the skin was closed using staples. A total of 15 cc of 0.5% Sensorcaine with epinephrine was injected in and around the incision site to help with postoperative analgesia  before a sterile occlusive dressing was applied to the knee. An Ace wrap was placed around the knee to accommodate a Polar Care pack before the patient was placed into a hinged knee brace with the hinges set at 0-70, but locked in extension.   Next the left distal radius fracture was addressed. A second timeout was performed to verify the appropriate surgical site before the limb was exsanguinated with an Esmarch and the tourniquet inflated to 250 mmHg. Given the amount of swelling in the hand, it was elected to proceed with a prophylactic carpal tunnel release. An approximately 3 to 4 cm incision was made over the palmar aspect of the hand. The incision was carried out through the subcutaneous tissues to expose the transverse carpal ligament. The transverse carpal ligament was carefully incised from proximal to distal along the ulnar most aspect of the carpal tunnel after inserting a Freer elevator into the ulnar margin of the carpal tunnel to protect the underlying median nerve.    An approximately 7-8 cm incision was made over the volar aspect of the distal radius beginning at the volar flexion crease and extending proximally along the flexor carpi radialis tendon. The incision was carried down through the subcutaneous tissues to expose the superficial retinaculum. This was split the length of the incision directly over the flexor carpi radialis tendon. The FCR sheath was opened and the tendon retracted ulnarly to protect the median nerve. The floor of the FCR sheath was opened to expose the pronator quadratus muscle. This was released along the radial insertion and the muscle was retracted ulnarly to expose the distal radius.   The fracture was identified and soft tissues elevated off the distal metaphyseal region for several centimeters. The fracture fragments were carefully reduced and apparently stabilized before an appropriate sized plate was selected and positioned on the distal radius. A guidewire  was placed through the distal hole and its position verified using FluoroScan imaging in AP and lateral projections. After several attempts, the pin was positioned parallel to the distal articular surface and approximately 3-4 mm proximal to the articular surface. The plate then was secured proximally with a second guidewire. Again the position of the plate was verified using FluoroScan imaging in AP and lateral projections and found to be excellent.   The plate was secured using a nonlocking bicortical screw proximally to draw the plate down to the shaft and stabilize the volar fragments against the stable dorsal cortex. Distally, the holes were filled with locking pegs of the appropriate lengths. The adequacy of hardware position and fracture reduction was verified using FluoroScan imaging in AP, lateral, and several additional oblique projections to be sure that the hardware did not enter the joint nor did it penetrate dorsally.  The radial styloid screw was felt to be displacing the radial cortical fragment rather than stabilizing it, so it was removed.   Proximally, the plate was secured to the metaphyseal region using one more nonlocking bicortical screw and a locking bicortical screw. Again the construct was assessed using FluoroScan imaging in AP, lateral, and oblique projections and found to be excellent.  The wound was copiously irrigated with sterile saline solution before the pronator quadratus was reapproximated using 2-0 Vicryl interrupted sutures. The subcutaneous tissues were closed using 3-0  Vicryl interrupted sutures before the subcuticular layer was closed using 3-0 Vicryl inverted interrupted sutures. Benzoin and Steri-Strips are applied to the skin. More distally, the incision to release the transverse carpal ligament was closed using 4-0 Prolene interrupted sutures. A total of 15 cc of 0.5% plain Sensorcaine was injected in and around the incision site to help with postoperative analgesia  before a sterile bulky dressing was applied to the wound. The patient was placed into a volar splint maintaining the wrist in neutral position before the patient was awakened, extubated, and returned to the recovery room in satisfactory condition after tolerating the procedures well.

## 2020-08-27 NOTE — Discharge Instructions (Addendum)
Orthopedic discharge instructions (left knee): Keep dressing dry and intact.  May shower after dressing changed on post-op day #4 (Tuesday).  Cover staples with Band-Aids after drying off. Apply ice frequently to knee or use Polar Care. Take ibuprofen 600-800 mg TID with meals for 7-10 days, then as necessary. Take oxycodone as prescribed when needed.  May supplement with ES Tylenol if necessary. May weight-bear as tolerated so long as in brace locked in extension - use cane in right hand as needed for balance and support. Follow-up in 10-14 days or as scheduled.   Orthopedic discharge instructions (left wrist): Keep splint dry and intact. Keep hand elevated above heart level. Apply ice to affected area frequently. Take ibuprofen 600-800 mg TID with meals for 7-10 days, then as necessary. Take pain medication as prescribed or ES Tylenol when needed.  Return for follow-up in 10-14 days or as scheduled.   AMBULATORY SURGERY  DISCHARGE INSTRUCTIONS   1) The drugs that you were given will stay in your system until tomorrow so for the next 24 hours you should not:  A) Drive an automobile B) Make any legal decisions C) Drink any alcoholic beverage   2) You may resume regular meals tomorrow.  Today it is better to start with liquids and gradually work up to solid foods.  You may eat anything you prefer, but it is better to start with liquids, then soup and crackers, and gradually work up to solid foods.   3) Please notify your doctor immediately if you have any unusual bleeding, trouble breathing, redness and pain at the surgery site, drainage, fever, or pain not relieved by medication.    4) Additional Instructions:        Please contact your physician with any problems or Same Day Surgery at [phone], Monday through Friday 6 am to 4 pm, or [place] [facility] number at [phone].

## 2020-08-27 NOTE — Transfer of Care (Signed)
Immediate Anesthesia Transfer of Care Note  Patient: Rebecca Roman  Procedure(s) Performed: OPEN REDUCTION INTERNAL (ORIF) FIXATION PATELLA (Left Knee) OPEN REDUCTION INTERNAL FIXATION (ORIF) WRIST FRACTURE (Left Wrist)  Patient Location: PACU  Anesthesia Type:General  Level of Consciousness: sedated  Airway & Oxygen Therapy: Patient Spontanous Breathing and Patient connected to face mask oxygen  Post-op Assessment: Report given to RN and Post -op Vital signs reviewed and stable  Post vital signs: Reviewed  Last Vitals:  Vitals Value Taken Time  BP    Temp    Pulse    Resp    SpO2      Last Pain:  Vitals:   08/27/20 1308  TempSrc:   PainSc: 10-Worst pain ever         Complications: No complications documented.

## 2020-08-30 ENCOUNTER — Encounter: Payer: Self-pay | Admitting: Surgery

## 2020-08-30 DIAGNOSIS — S82035A Nondisplaced transverse fracture of left patella, initial encounter for closed fracture: Secondary | ICD-10-CM | POA: Insufficient documentation

## 2020-08-30 DIAGNOSIS — S52562A Barton's fracture of left radius, initial encounter for closed fracture: Secondary | ICD-10-CM | POA: Insufficient documentation

## 2020-09-01 NOTE — Anesthesia Postprocedure Evaluation (Signed)
Anesthesia Post Note  Patient: Zenovia Justman  Procedure(s) Performed: OPEN REDUCTION INTERNAL (ORIF) FIXATION PATELLA (Left Knee) OPEN REDUCTION INTERNAL FIXATION (ORIF) WRIST FRACTURE (Left Wrist)  Patient location during evaluation: PACU Anesthesia Type: General Level of consciousness: awake and alert Pain management: pain level controlled Vital Signs Assessment: post-procedure vital signs reviewed and stable Respiratory status: spontaneous breathing, nonlabored ventilation, respiratory function stable and patient connected to nasal cannula oxygen Cardiovascular status: blood pressure returned to baseline and stable Postop Assessment: no apparent nausea or vomiting Anesthetic complications: no   No complications documented.   Last Vitals:  Vitals:   08/27/20 2225 08/27/20 2257  BP: 140/80 (!) 136/95  Pulse: 93 87  Resp: 20 18  Temp:    SpO2: 96% 99%    Last Pain:  Vitals:   08/27/20 2257  TempSrc:   PainSc: 0-No pain                 Molli Barrows

## 2020-10-11 ENCOUNTER — Other Ambulatory Visit: Payer: Self-pay | Admitting: Family Medicine

## 2020-10-20 ENCOUNTER — Encounter: Payer: Self-pay | Admitting: Radiology

## 2020-11-12 ENCOUNTER — Other Ambulatory Visit: Payer: Self-pay | Admitting: Family Medicine

## 2020-11-12 DIAGNOSIS — Z1231 Encounter for screening mammogram for malignant neoplasm of breast: Secondary | ICD-10-CM

## 2020-12-13 ENCOUNTER — Other Ambulatory Visit: Payer: Self-pay | Admitting: *Deleted

## 2020-12-13 MED ORDER — LEVOTHYROXINE SODIUM 50 MCG PO TABS
50.0000 ug | ORAL_TABLET | Freq: Every day | ORAL | 2 refills | Status: DC
Start: 2020-12-13 — End: 2021-01-27

## 2020-12-23 ENCOUNTER — Other Ambulatory Visit: Payer: Self-pay | Admitting: *Deleted

## 2020-12-23 MED ORDER — MEDROXYPROGESTERONE ACETATE 2.5 MG PO TABS
2.5000 mg | ORAL_TABLET | Freq: Every day | ORAL | 1 refills | Status: DC
Start: 2020-12-23 — End: 2021-01-27

## 2020-12-27 ENCOUNTER — Ambulatory Visit: Payer: BC Managed Care – PPO | Admitting: Family Medicine

## 2020-12-30 ENCOUNTER — Ambulatory Visit
Admission: RE | Admit: 2020-12-30 | Discharge: 2020-12-30 | Disposition: A | Discharge status: Home / Self Care | Payer: BC Managed Care – PPO | Source: Ambulatory Visit | Attending: Family Medicine | Admitting: Family Medicine

## 2020-12-30 ENCOUNTER — Other Ambulatory Visit: Payer: Self-pay

## 2020-12-30 DIAGNOSIS — Z1231 Encounter for screening mammogram for malignant neoplasm of breast: Secondary | ICD-10-CM | POA: Diagnosis not present

## 2021-01-27 ENCOUNTER — Encounter: Payer: Self-pay | Admitting: Obstetrics & Gynecology

## 2021-01-27 ENCOUNTER — Other Ambulatory Visit (HOSPITAL_COMMUNITY)
Admission: RE | Admit: 2021-01-27 | Discharge: 2021-01-27 | Disposition: A | Discharge status: Home / Self Care | Payer: BC Managed Care – PPO | Source: Ambulatory Visit | Attending: Family Medicine | Admitting: Family Medicine

## 2021-01-27 ENCOUNTER — Other Ambulatory Visit: Payer: Self-pay

## 2021-01-27 ENCOUNTER — Ambulatory Visit (INDEPENDENT_AMBULATORY_CARE_PROVIDER_SITE_OTHER): Payer: BC Managed Care – PPO | Admitting: Obstetrics & Gynecology

## 2021-01-27 VITALS — BP 149/87 | HR 88 | Wt 168.0 lb

## 2021-01-27 DIAGNOSIS — R87610 Atypical squamous cells of undetermined significance on cytologic smear of cervix (ASC-US): Secondary | ICD-10-CM

## 2021-01-27 DIAGNOSIS — R87810 Cervical high risk human papillomavirus (HPV) DNA test positive: Secondary | ICD-10-CM | POA: Diagnosis present

## 2021-01-27 DIAGNOSIS — E039 Hypothyroidism, unspecified: Secondary | ICD-10-CM

## 2021-01-27 DIAGNOSIS — Z975 Presence of (intrauterine) contraceptive device: Secondary | ICD-10-CM | POA: Diagnosis not present

## 2021-01-27 DIAGNOSIS — Z01419 Encounter for gynecological examination (general) (routine) without abnormal findings: Secondary | ICD-10-CM | POA: Insufficient documentation

## 2021-01-27 MED ORDER — LEVOTHYROXINE SODIUM 50 MCG PO TABS: 50.0000 ug | ORAL_TABLET | Freq: Every day | ORAL | 4 refills | Status: DC

## 2021-01-27 NOTE — Progress Notes (Signed)
GYNECOLOGY ANNUAL PREVENTATIVE CARE ENCOUNTER NOTE  History:     Rebecca Roman is a 55 y.o. G70P2001 female here for a routine annual gynecologic exam.  Current complaints: none.  Had normal thyroid labs at [facility], needs refill of Synthroid.   Denies abnormal vaginal bleeding, discharge, pelvic pain, problems with intercourse or other gynecologic concerns.    Gynecologic History No LMP recorded. (Menstrual status: IUD). Contraception: Liletta IUD in place since 2018 Last Pap: 12/16/2019. Results were: abnormal with  ASCUS and positive HPV, no colposcopy done. Preceding pap in 2019 was normal with negative HPV Last mammogram: 12/30/2020. Results were: normal  Obstetric History OB History  Gravida Para Term Preterm AB Living  3 2 2  0 0 1  SAB IAB Ectopic Multiple Live Births  0 0 0 0 1    # Outcome Date GA Lbr Len/2nd Weight Sex Delivery Anes PTL Lv  3 Term 07/27/11 [redacted]w[redacted]d  9 lb 10.5 oz (4.38 kg) M CS-LTranv Spinal  LIV  2 Gravida              Birth Comments: System Generated. Please review and update pregnancy details.  1 Term             Past Medical History:  Diagnosis Date  . Cancer (HCC)    basal cell on lt leg  . Environmental allergies   . Motion sickness   . Wears contact lenses     Past Surgical History:  Procedure Laterality Date  . CESAREAN SECTION  07/27/2011   Procedure: CESAREAN SECTION;  Surgeon: Mora Bellman, MD;  Location: [facility];  Service: Gynecology;  Laterality: N/A;  . COLONOSCOPY    . IMAGE GUIDED SINUS SURGERY N/A 06/05/2018   Procedure: IMAGE GUIDED SINUS SURGERY;  Surgeon: Margaretha Sheffield, MD;  Location: [place];  Service: ENT;  Laterality: N/A;  GAVE DISK TO BRENDA 4.4.19 DS gave 2nd disk to brenda 4.25.19  . MAXILLARY ANTROSTOMY Bilateral 06/05/2018   Procedure: MAXILLARY ANTROSTOMY NO TISSUE REMOVAL;  Surgeon: Margaretha Sheffield, MD;  Location: [place];  Service: ENT;  Laterality: Bilateral;  . ORIF PATELLA Left 08/27/2020    Procedure: OPEN REDUCTION INTERNAL (ORIF) FIXATION PATELLA;  Surgeon: Corky Mull, MD;  Location: [facility];  Service: Orthopedics;  Laterality: Left;  . ORIF WRIST FRACTURE Left 08/27/2020   Procedure: OPEN REDUCTION INTERNAL FIXATION (ORIF) WRIST FRACTURE;  Surgeon: Corky Mull, MD;  Location: [facility];  Service: Orthopedics;  Laterality: Left;  . SEPTOPLASTY N/A 06/05/2018   Procedure: SEPTOPLASTY;  Surgeon: Margaretha Sheffield, MD;  Location: [place];  Service: ENT;  Laterality: N/A;  . TURBINATE REDUCTION Bilateral 06/05/2018   Procedure: BILATERAL INFERIOR TURBINATE REDUCTION / RIGHT MIDDLE [place];  Surgeon: Margaretha Sheffield, MD;  Location: [facility];  Service: ENT;  Laterality: Bilateral;    Current Outpatient Medications on File Prior to Visit  Medication Sig Dispense Refill  . cetirizine (ZYRTEC) 10 MG tablet Take 10 mg by mouth daily.    . fluticasone (FLONASE) 50 MCG/ACT nasal spray Place 2 sprays into both nostrils daily. 16 g 11  . levonorgestrel (MIRENA) 20 MCG/24HR IUD 1 each by Intrauterine route once.    . montelukast (SINGULAIR) 10 MG tablet Take 1 tablet (10 mg total) by mouth at bedtime. 90 tablet 3  . pseudoephedrine (SUDAFED) 30 MG tablet Take 30 mg by mouth every 6 (six) hours as needed for congestion.     No current facility-administered medications on file  prior to visit.    Allergies  Allergen Reactions  . Scallops [Shellfish Allergy] Nausea And Vomiting    Only scallops, not any other seafood    Social History:  reports that she has never smoked. She has never used smokeless tobacco. She reports current alcohol use of about 4.0 standard drinks of alcohol per week. She reports that she does not use drugs.  Family History  Problem Relation Age of Onset  . Stroke Father   . Hypertension Father   . Thyroid disease Father   . Thyroid disease Brother   . Thyroid disease Sister   . Breast cancer Neg Hx     The following portions of  the patient's history were reviewed and updated as appropriate: allergies, current medications, past family history, past medical history, past social history, past surgical history and problem list.  Review of Systems Pertinent items noted in HPI and remainder of comprehensive ROS otherwise negative.  Physical Exam:  BP (!) 149/87   Pulse 88   Wt 168 lb (76.2 kg)   BMI 24.81 kg/m  CONSTITUTIONAL: Well-developed, well-nourished female in no acute distress.  HENT:  Normocephalic, atraumatic, External right and left ear normal.  EYES: Conjunctivae and EOM are normal. Pupils are equal, round, and reactive to light. No scleral icterus.  NECK: Normal range of motion, supple, no masses.  Normal thyroid.  SKIN: Skin is warm and dry. No rash noted. Not diaphoretic. No erythema. No pallor. MUSCULOSKELETAL: Normal range of motion. No tenderness.  No cyanosis, clubbing, or edema. NEUROLOGIC: Alert and oriented to person, place, and time. Normal reflexes, muscle tone coordination.  PSYCHIATRIC: Normal mood and affect. Normal behavior. Normal judgment and thought content. CARDIOVASCULAR: Normal heart rate noted, regular rhythm RESPIRATORY: Clear to auscultation bilaterally. Effort and breath sounds normal, no problems with respiration noted. BREASTS: Symmetric in size. No masses, tenderness, skin changes, nipple drainage, or lymphadenopathy bilaterally. Performed in the presence of a chaperone. ABDOMEN: Soft, no distention noted.  No tenderness, rebound or guarding.  PELVIC: Normal appearing external genitalia and urethral meatus; normal appearing vaginal mucosa and cervix. Mild atrophy noted. Liletta strings visualized. No abnormal discharge noted.  Pap smear obtained.  Normal uterine size, no other palpable masses, no uterine or adnexal tenderness.  Performed in the presence of a chaperone.   Assessment and Plan:    1. Hypothyroidism, unspecified type Synthroid refilled. - levothyroxine (SYNTHROID)  50 MCG tablet; Take 1 tablet (50 mcg total) by mouth daily before breakfast.  Dispense: 90 tablet; Refill: 4  2. IUD (intrauterine device) in place since 2018 No issues. Patient wishes to keep in place forever.  3. 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 Well woman exam with routine gynecological exam - Cytology - PAP Will follow up results of pap smear and manage accordingly, discussed possible need for colposcopy if abnormal. If normal, needs yearly paps for next two years. Mammogram is up to date. Routine preventative health maintenance measures emphasized. Please refer to After Visit Summary for other counseling recommendations.      Verita Schneiders, MD, [place] for [employer], [place]

## 2021-01-31 LAB — CYTOLOGY - PAP
Comment: NEGATIVE
Diagnosis: NEGATIVE
Diagnosis: REACTIVE
High risk HPV: NEGATIVE

## 2021-11-17 ENCOUNTER — Other Ambulatory Visit: Payer: Self-pay | Admitting: Family Medicine

## 2021-11-17 DIAGNOSIS — Z1231 Encounter for screening mammogram for malignant neoplasm of breast: Secondary | ICD-10-CM

## 2022-01-09 ENCOUNTER — Other Ambulatory Visit: Payer: Self-pay

## 2022-01-09 ENCOUNTER — Ambulatory Visit
Admission: RE | Admit: 2022-01-09 | Discharge: 2022-01-09 | Disposition: A | Discharge status: Home / Self Care | Payer: BC Managed Care – PPO | Source: Ambulatory Visit | Attending: Family Medicine | Admitting: Family Medicine

## 2022-01-09 DIAGNOSIS — Z1231 Encounter for screening mammogram for malignant neoplasm of breast: Secondary | ICD-10-CM | POA: Insufficient documentation

## 2022-02-01 ENCOUNTER — Ambulatory Visit (INDEPENDENT_AMBULATORY_CARE_PROVIDER_SITE_OTHER): Payer: BC Managed Care – PPO | Admitting: Family Medicine

## 2022-02-01 ENCOUNTER — Other Ambulatory Visit (HOSPITAL_COMMUNITY)
Admission: RE | Admit: 2022-02-01 | Discharge: 2022-02-01 | Disposition: A | Discharge status: Home / Self Care | Payer: BC Managed Care – PPO | Source: Ambulatory Visit | Attending: Family Medicine | Admitting: Family Medicine

## 2022-02-01 ENCOUNTER — Encounter: Payer: Self-pay | Admitting: Family Medicine

## 2022-02-01 ENCOUNTER — Other Ambulatory Visit: Payer: Self-pay

## 2022-02-01 VITALS — BP 136/89 | HR 84 | Ht 69.0 in | Wt 168.4 lb

## 2022-02-01 DIAGNOSIS — Z124 Encounter for screening for malignant neoplasm of cervix: Secondary | ICD-10-CM | POA: Diagnosis not present

## 2022-02-01 DIAGNOSIS — Z01419 Encounter for gynecological examination (general) (routine) without abnormal findings: Secondary | ICD-10-CM

## 2022-02-01 NOTE — Progress Notes (Signed)
Subjective:     Rebecca Roman is a 56 y.o. female and is here for a comprehensive physical exam. The patient reports no problems.      The following portions of the patient's history were reviewed and updated as appropriate: allergies, current medications, past family history, past medical history, past social history, past surgical history, and problem list.  Review of Systems Pertinent items noted in HPI and remainder of comprehensive ROS otherwise negative.   Objective:    BP 136/89    Pulse 84    Ht 5\' 9"  (1.753 m)    Wt 168 lb 6.4 oz (76.4 kg)    BMI 24.87 kg/m  General appearance: alert, cooperative, and appears stated age Head: Normocephalic, without obvious abnormality, atraumatic Neck: no adenopathy, supple, symmetrical, trachea midline, and thyroid not enlarged, symmetric, no tenderness/mass/nodules Lungs: clear to auscultation bilaterally Breasts: normal appearance, no masses or tenderness Heart: regular rate and rhythm, S1, S2 normal, no murmur, click, rub or gallop Abdomen: soft, non-tender; bowel sounds normal; no masses,  no organomegaly Pelvic: cervix normal in appearance, external genitalia normal, no adnexal masses or tenderness, no cervical motion tenderness, uterus normal size, shape, and consistency, vagina normal without discharge, and IUD strings seen Extremities: Homans sign is negative, no sign of DVT Pulses: 2+ and symmetric Skin: Skin color, texture, turgor normal. No rashes or lesions Lymph nodes: Cervical, supraclavicular, and axillary nodes normal. Neurologic: Grossly normal     Assessment:    GYN female exam.      Plan:  Screening for malignant neoplasm of cervix - Plan: Cytology - PAP( [facility])  Encounter for gynecological examination without abnormal finding Return in 1 year (on 02/01/2023).    See After Visit Summary for Counseling Recommendations

## 2022-02-10 LAB — CYTOLOGY - PAP
Comment: NEGATIVE
Diagnosis: NEGATIVE
High risk HPV: NEGATIVE

## 2022-04-07 ENCOUNTER — Other Ambulatory Visit: Payer: Self-pay | Admitting: Obstetrics & Gynecology

## 2022-04-07 DIAGNOSIS — E039 Hypothyroidism, unspecified: Secondary | ICD-10-CM

## 2022-04-12 ENCOUNTER — Encounter: Payer: Self-pay | Admitting: Family Medicine

## 2022-04-12 ENCOUNTER — Ambulatory Visit (INDEPENDENT_AMBULATORY_CARE_PROVIDER_SITE_OTHER): Payer: BC Managed Care – PPO | Admitting: Family Medicine

## 2022-04-12 VITALS — BP 134/83 | HR 83 | Wt 164.0 lb

## 2022-04-12 DIAGNOSIS — N9419 Other specified dyspareunia: Secondary | ICD-10-CM

## 2022-04-12 DIAGNOSIS — R102 Pelvic and perineal pain: Secondary | ICD-10-CM | POA: Diagnosis not present

## 2022-04-12 DIAGNOSIS — E039 Hypothyroidism, unspecified: Secondary | ICD-10-CM | POA: Diagnosis not present

## 2022-04-12 DIAGNOSIS — Z30432 Encounter for removal of intrauterine contraceptive device: Secondary | ICD-10-CM

## 2022-04-12 MED ORDER — LEVOTHYROXINE SODIUM 50 MCG PO TABS: 50.0000 ug | ORAL_TABLET | Freq: Every day | ORAL | 4 refills | Status: DC

## 2022-04-12 NOTE — Progress Notes (Signed)
? ?  Subjective:  ? ? Patient ID: Rebecca Roman is a 56 y.o. female presenting with No chief complaint on file. ? on 04/12/2022 ? ?HPI: ?Noted some pain during intercourse. Partner had some pain and felt something inside other than strings. Thinks her IUD moved. No cycle. [place] a few years ago was 104. ?Needs a refill on her synthroid. ? ?Review of Systems  ?Constitutional:  Negative for chills and fever.  ?Respiratory:  Negative for shortness of breath.   ?Cardiovascular:  Negative for chest pain.  ?Gastrointestinal:  Negative for abdominal pain, nausea and vomiting.  ?Genitourinary:  Negative for dysuria.  ?Skin:  Negative for rash.  ?   ?Objective:  ?  ?BP 134/83   Pulse 83   Wt 164 lb (74.4 kg)   BMI 24.22 kg/m?  ?Physical Exam ?Exam conducted with a chaperone present.  ?Constitutional:   ?   General: She is not in acute distress. ?   Appearance: She is well-developed.  ?HENT:  ?   Head: Normocephalic and atraumatic.  ?Eyes:  ?   General: No scleral icterus. ?Cardiovascular:  ?   Rate and Rhythm: Normal rate.  ?Pulmonary:  ?   Effort: Pulmonary effort is normal.  ?Abdominal:  ?   Palpations: Abdomen is soft.  ?Musculoskeletal:  ?   Cervical back: Neck supple.  ?Skin: ?   General: Skin is warm and dry.  ?Neurological:  ?   Mental Status: She is alert and oriented to person, place, and time.  ? ?Procedure: ?Speculum placed inside vagina.  Cervix visualized.  Strings grasped with ring forceps.  IUD removed intact. ?   ?Assessment & Plan:  ? ?Problem List Items Addressed This Visit   ? ?  ? Unprioritized  ? Hypothyroid  ?  Refilled Synthroid. Last TSH in 2/23 was 3. ? ?  ?  ? Relevant Medications  ? levothyroxine (SYNTHROID) 50 MCG tablet  ? ?Other Visit Diagnoses   ? ? Pelvic pain    -  Primary  ? IUD causing her pain--removed. She is menopausal. Return if no improvement and consider pelvic sonogram.  ? ?  ? ? ?Return if symptoms worsen or fail to improve. ? ?Donnamae Jude, MD ?04/12/2022 ?1:09 PM ? ? ?

## 2022-04-12 NOTE — Progress Notes (Signed)
RGYN patient presents for problem visit today. ?Complains of pain with intercourse /Pelvic Pressure. ?pt notes a "poking" sensation/being uncomfortable.   ?Pt had husband to feel inside and he was able to feel IUD device. ?Pt likes not having cycles however ,unsure if IUD needs to be removed that she wants it replaced.  ? ?Denies any pain w/ urination.  ? ?  ? ? ? ? ?

## 2022-04-12 NOTE — Assessment & Plan Note (Signed)
Refilled Synthroid. Last TSH in 2/23 was 3. ?

## 2022-05-01 ENCOUNTER — Ambulatory Visit
Admission: EM | Admit: 2022-05-01 | Discharge: 2022-05-01 | Disposition: A | Discharge status: Home / Self Care | Payer: BC Managed Care – PPO | Attending: Emergency Medicine | Admitting: Emergency Medicine

## 2022-05-01 DIAGNOSIS — J0100 Acute maxillary sinusitis, unspecified: Secondary | ICD-10-CM | POA: Diagnosis not present

## 2022-05-01 MED ORDER — AZITHROMYCIN 250 MG PO TABS: 250.0000 mg | ORAL_TABLET | Freq: Every day | ORAL | 0 refills | Status: DC

## 2022-05-01 MED ORDER — AMOXICILLIN-POT CLAVULANATE 875-125 MG PO TABS: 1.0000 | ORAL_TABLET | Freq: Two times a day (BID) | ORAL | 0 refills | Status: AC

## 2022-05-01 MED ORDER — ONDANSETRON HCL 4 MG PO TABS: 4.0000 mg | ORAL_TABLET | Freq: Three times a day (TID) | ORAL | 0 refills | Status: DC | PRN

## 2022-05-01 NOTE — Discharge Instructions (Signed)
Today you are being treated for an sinus infection  Stop taking amoxicillin and begin use of Augmentin twice daily for 10 days  Begin use of azithromycin taking every morning as directed  Stop use of pseudoephedrine as this may causing rebound congestion  You may continue use of Mucinex, Zyrtec, Singulair, Flonase for management of symptoms  May Use of saline irrigation for additional comfort  Increase your oral fluid intake to thin out your mucus so that is also able for your body to clear more easily.  If you develop any new or worsening symptoms return for reevaluation or see your primary care provider.

## 2022-05-01 NOTE — ED Provider Notes (Signed)
[facility]    CSN: [id number] Arrival date & time: 05/01/22  1834      History   Chief Complaint Chief Complaint  Patient presents with   Nasal Congestion   Nausea   Shortness of Breath    HPI Rebecca Roman is a 56 y.o. female.   Patient presents with low-grade fever, nasal congestion, bilateral ear fullness, rhinorrhea, sinus pain and pressure, nausea without vomiting, nonproductive cough and intermittent shortness of breath with exertion for 7 days.  Known sick contact in household.  Decreased appetite, tolerating fluids.  Had a virtual visit 4 days ago, started on amoxicillin, has taken 3 days worth of medicine.  Has also attempt Mucinex, Singulair, Zyrtec and pseudoephedrine with minimal relief.  History of seasonal allergies.   Past Medical History:  Diagnosis Date   Cancer ([place])    basal cell on lt leg   Environmental allergies    Motion sickness    Wears contact lenses     Patient Active Problem List   Diagnosis Date Noted   Hypothyroid 04/12/2022   Closed Barton's fracture of left radius 08/30/2020   Closed nondisplaced transverse fracture of left patella 08/30/2020   Hot flashes 07/09/2014    Past Surgical History:  Procedure Laterality Date   CESAREAN SECTION  07/27/2011   Procedure: CESAREAN SECTION;  Surgeon: Mora Bellman, MD;  Location: [place] [facility];  Service: Gynecology;  Laterality: N/A;   COLONOSCOPY     IMAGE GUIDED SINUS SURGERY N/A 06/05/2018   Procedure: IMAGE GUIDED SINUS SURGERY;  Surgeon: Margaretha Sheffield, MD;  Location: [place];  Service: ENT;  Laterality: N/A;  GAVE DISK TO BRENDA 4.4.19 DS gave 2nd disk to brenda 4.25.19   MAXILLARY ANTROSTOMY Bilateral 06/05/2018   Procedure: MAXILLARY ANTROSTOMY NO TISSUE REMOVAL;  Surgeon: Margaretha Sheffield, MD;  Location: [place];  Service: ENT;  Laterality: Bilateral;   ORIF PATELLA Left 08/27/2020   Procedure: OPEN REDUCTION INTERNAL (ORIF) FIXATION PATELLA;  Surgeon: Corky Mull, MD;  Location: [facility];  Service: Orthopedics;  Laterality: Left;   ORIF WRIST FRACTURE Left 08/27/2020   Procedure: OPEN REDUCTION INTERNAL FIXATION (ORIF) WRIST FRACTURE;  Surgeon: Corky Mull, MD;  Location: [facility];  Service: Orthopedics;  Laterality: Left;   SEPTOPLASTY N/A 06/05/2018   Procedure: SEPTOPLASTY;  Surgeon: Margaretha Sheffield, MD;  Location: [place];  Service: ENT;  Laterality: N/A;   TURBINATE REDUCTION Bilateral 06/05/2018   Procedure: BILATERAL INFERIOR TURBINATE REDUCTION / RIGHT MIDDLE [place];  Surgeon: Margaretha Sheffield, MD;  Location: [place];  Service: ENT;  Laterality: Bilateral;    OB History     Gravida  3   Para  2   Term  2   Preterm  0   AB  0   Living  1      SAB  0   IAB  0   Ectopic  0   Multiple  0   Live Births  1            Home Medications    Prior to Admission medications   Medication Sig Start Date End Date Taking? Authorizing Provider  acetaminophen (TYLENOL) 500 MG tablet Take 500 mg by mouth every 6 (six) hours as needed.   Yes [provider]  cetirizine (ZYRTEC) 10 MG tablet Take 10 mg by mouth daily.   Yes [provider]  Cholecalciferol (VITAMIN D3) 1.25 MG (50000 UT) CAPS Take by mouth.   Yes  [provider]  Cranberry 600 MG TABS Take by mouth. Pt is taking 650 mg daily   Yes [provider]  fluticasone (FLONASE) 50 MCG/ACT nasal spray Place 2 sprays into both nostrils daily. 09/10/17  Yes Donnamae Jude, MD  Glucosamine-Chondroit-Vit C-Mn (GLUCOSAMINE 1500 COMPLEX) CAPS Take by mouth.   Yes [provider]  ibuprofen (ADVIL) 400 MG tablet Take 400 mg by mouth every 6 (six) hours as needed.   Yes [provider]  levothyroxine (SYNTHROID) 50 MCG tablet Take 1 tablet (50 mcg total) by mouth daily before breakfast. 04/12/22  Yes Donnamae Jude, MD  Magnesium 500 MG CAPS Take by mouth.   Yes [provider]  melatonin 5 MG  TABS Take 5 mg by mouth.   Yes [provider]  montelukast (SINGULAIR) 10 MG tablet Take 1 tablet (10 mg total) by mouth at bedtime. 02/14/17  Yes Donnamae Jude, MD  Multiple Vitamin (MULTIVITAMIN) tablet Take 1 tablet by mouth daily.   Yes [provider]  Omega-3 Fatty Acids (FISH OIL) 1360 MG CAPS Take by mouth. Pt is taking 1400 mg daily   Yes [provider]  pseudoephedrine (SUDAFED) 30 MG tablet Take 30 mg by mouth every 6 (six) hours as needed for congestion.   Yes [provider]    Family History Family History  Problem Relation Age of Onset   Stroke Father    Hypertension Father    Thyroid disease Father    Thyroid disease Brother    Thyroid disease Sister    Breast cancer Neg Hx     Social History Social History   Tobacco Use   Smoking status: Never   Smokeless tobacco: Never  Vaping Use   Vaping Use: Never used  Substance Use Topics   Alcohol use: Yes    Alcohol/week: 4.0 standard drinks    Types: 4 Glasses of wine per week    Comment: ocssonaly   Drug use: No     Allergies   Scallops [shellfish allergy]   Review of Systems Review of Systems  Constitutional:  Positive for fever. Negative for activity change, appetite change, chills, diaphoresis, fatigue and unexpected weight change.  HENT:  Positive for congestion, ear pain, rhinorrhea, sinus pressure and sinus pain. Negative for dental problem, drooling, ear discharge, facial swelling, hearing loss, mouth sores, nosebleeds, postnasal drip, sneezing, sore throat, tinnitus, trouble swallowing and voice change.   Respiratory:  Positive for cough and shortness of breath. Negative for apnea, choking, chest tightness, wheezing and stridor.   Cardiovascular: Negative.   Gastrointestinal: Negative.   Musculoskeletal: Negative.   Skin: Negative.     Physical Exam Triage Vital Signs ED Triage Vitals  Enc Vitals Group     BP 05/01/22 1904 (!) 157/99     Pulse Rate 05/01/22  1904 98     Resp 05/01/22 1904 18     Temp 05/01/22 1904 98.3 F (36.8 C)     Temp Source 05/01/22 1904 Oral     SpO2 05/01/22 1904 99 %     Weight 05/01/22 1902 153 lb (69.4 kg)     Height 05/01/22 1902 '5\' 9"'$  (1.753 m)     Head Circumference --      Peak Flow --      Pain Score 05/01/22 1901 7     Pain Loc --      Pain Edu? --      Excl. in GC? --    No  data found.  Updated Vital Signs BP (!) 157/99 (BP Location: Left Arm)   Pulse 98   Temp 98.3 F (36.8 C) (Oral)   Resp 18   Ht '5\' 9"'$  (1.753 m)   Wt 153 lb (69.4 kg)   LMP 03/04/2015   SpO2 99%   BMI 22.59 kg/m   Visual Acuity Right Eye Distance:   Left Eye Distance:   Bilateral Distance:    Right Eye Near:   Left Eye Near:    Bilateral Near:     Physical Exam Constitutional:      Appearance: Normal appearance.  HENT:     Head: Normocephalic.     Right Ear: Tympanic membrane, ear canal and external ear normal.     Left Ear: Tympanic membrane, ear canal and external ear normal.     Nose: Congestion and rhinorrhea present.     Right Turbinates: Swollen.     Left Turbinates: Swollen.     Right Sinus: Maxillary sinus tenderness present. No frontal sinus tenderness.     Left Sinus: Maxillary sinus tenderness present. No frontal sinus tenderness.     Mouth/Throat:     Mouth: Mucous membranes are moist.     Pharynx: Oropharynx is clear.  Eyes:     Extraocular Movements: Extraocular movements intact.  Cardiovascular:     Rate and Rhythm: Normal rate and regular rhythm.     Pulses: Normal pulses.     Heart sounds: Normal heart sounds.  Pulmonary:     Effort: Pulmonary effort is normal.     Breath sounds: Normal breath sounds.  Musculoskeletal:     Cervical back: Normal range of motion and neck supple.  Skin:    General: Skin is warm and dry.  Neurological:     General: No focal deficit present.     Mental Status: She is alert and oriented to person, place, and time. Mental status is at baseline.   Psychiatric:        Mood and Affect: Mood normal.        Behavior: Behavior normal.     UC Treatments / Results  Labs (all labs ordered are listed, but only abnormal results are displayed) Labs Reviewed - No data to display  EKG   Radiology No results found.  Procedures Procedures (including critical care time)  Medications Ordered in UC Medications - No data to display  Initial Impression / Assessment and Plan / UC Course  I have reviewed the triage vital signs and the nursing notes.  Pertinent labs & imaging results that were available during my care of the patient were reviewed by me and considered in my medical decision making (see chart for details).  Acute nonrecurrent maxillary sinusitis  Vital signs are stable, O2 saturation 99% on room air, lungs are clear to auscultation, low suspicion for pneumonia, bronchitis or pneumothorax, will defer imaging today, will attempt to change antibiotics to see if that prior findings greater relief, Augmentin 10-day course as well as Z-Pak prescribed for outpatient management, prescribe Zofran for management of nausea, advised patient to discontinue pseudoephedrine due to possible rebound congestion, may continue use the remaining over-the-counter medications for treatment, urgent care follow-up as needed Final Clinical Impressions(s) / UC Diagnoses   Final diagnoses:  None   Discharge Instructions   None    ED Prescriptions   None    PDMP not reviewed this encounter.   Hans Eden, NP 05/01/22 1946

## 2022-05-01 NOTE — ED Triage Notes (Signed)
Pt c/o congestion, nausea, SOB, x1week  Pt husband had an upper respiratory infection.  Pt was given amoxicillin for her symptoms.

## 2022-08-05 ENCOUNTER — Other Ambulatory Visit: Payer: Self-pay | Admitting: Family Medicine

## 2022-08-05 DIAGNOSIS — E039 Hypothyroidism, unspecified: Secondary | ICD-10-CM

## 2022-08-10 ENCOUNTER — Encounter: Payer: Self-pay | Admitting: Family Medicine

## 2022-08-10 DIAGNOSIS — E039 Hypothyroidism, unspecified: Secondary | ICD-10-CM

## 2022-08-10 MED ORDER — LEVOTHYROXINE SODIUM 50 MCG PO TABS: 50.0000 ug | ORAL_TABLET | Freq: Every day | ORAL | 4 refills | Status: DC

## 2022-08-21 ENCOUNTER — Inpatient Hospital Stay
Admission: RE | Admit: 2022-08-21 | Discharge: 2022-08-21 | Disposition: A | Discharge status: Home / Self Care | Payer: BC Managed Care – PPO | Source: Ambulatory Visit | Attending: Family Medicine | Admitting: Family Medicine

## 2022-08-24 ENCOUNTER — Ambulatory Visit (INDEPENDENT_AMBULATORY_CARE_PROVIDER_SITE_OTHER): Payer: BC Managed Care – PPO

## 2022-08-24 ENCOUNTER — Ambulatory Visit
Admission: RE | Admit: 2022-08-24 | Discharge: 2022-08-24 | Disposition: A | Discharge status: Home / Self Care | Payer: BC Managed Care – PPO | Source: Ambulatory Visit

## 2022-08-24 VITALS — BP 135/95 | HR 88 | Temp 98.3°F | Ht 68.0 in | Wt 157.0 lb

## 2022-08-24 DIAGNOSIS — S8265XA Nondisplaced fracture of lateral malleolus of left fibula, initial encounter for closed fracture: Secondary | ICD-10-CM

## 2022-08-24 NOTE — ED Triage Notes (Addendum)
Pt states she was walking on track at son's football practice, pt states she rolled LT ankle stepping off track DOI: 08/23/22 swelling, has been elevating foot & applying ice

## 2022-08-24 NOTE — ED Provider Notes (Signed)
[facility]    CSN: [id number] Arrival date & time: 08/24/22  0946      History   Chief Complaint Chief Complaint  Patient presents with   Ankle Pain    HPI Rebecca Roman is a 56 y.o. female.   HPI  56 year old female here for evaluation of left ankle pain and swelling.  Patient reports that she rolled her left ankle while stepping off the edge of the track at her son's football practice yesterday evening.  She states that she is able to bear weight but it does hurt.  The pain and swelling on the outside of her ankle.  She denies any numbness or tingling in her toes.  She has been elevating her ankle, applying ice to her ankle, and using an Ace wrap.  Past Medical History:  Diagnosis Date   Cancer ([place])    basal cell on lt leg   Environmental allergies    Motion sickness    Wears contact lenses     Patient Active Problem List   Diagnosis Date Noted   Hypothyroid 04/12/2022   Closed Barton's fracture of left radius 08/30/2020   Closed nondisplaced transverse fracture of left patella 08/30/2020   Hot flashes 07/09/2014    Past Surgical History:  Procedure Laterality Date   CESAREAN SECTION  07/27/2011   Procedure: CESAREAN SECTION;  Surgeon: Mora Bellman, MD;  Location: [facility];  Service: Gynecology;  Laterality: N/A;   COLONOSCOPY     IMAGE GUIDED SINUS SURGERY N/A 06/05/2018   Procedure: IMAGE GUIDED SINUS SURGERY;  Surgeon: Margaretha Sheffield, MD;  Location: [facility];  Service: ENT;  Laterality: N/A;  GAVE DISK TO BRENDA 4.4.19 DS gave 2nd disk to brenda 4.25.19   MAXILLARY ANTROSTOMY Bilateral 06/05/2018   Procedure: MAXILLARY ANTROSTOMY NO TISSUE REMOVAL;  Surgeon: Margaretha Sheffield, MD;  Location: [place];  Service: ENT;  Laterality: Bilateral;   ORIF PATELLA Left 08/27/2020   Procedure: OPEN REDUCTION INTERNAL (ORIF) FIXATION PATELLA;  Surgeon: Corky Mull, MD;  Location: [facility];  Service: Orthopedics;  Laterality: Left;   ORIF WRIST  FRACTURE Left 08/27/2020   Procedure: OPEN REDUCTION INTERNAL FIXATION (ORIF) WRIST FRACTURE;  Surgeon: Corky Mull, MD;  Location: [facility];  Service: Orthopedics;  Laterality: Left;   SEPTOPLASTY N/A 06/05/2018   Procedure: SEPTOPLASTY;  Surgeon: Margaretha Sheffield, MD;  Location: [facility];  Service: ENT;  Laterality: N/A;   TURBINATE REDUCTION Bilateral 06/05/2018   Procedure: BILATERAL INFERIOR TURBINATE REDUCTION / RIGHT MIDDLE [place];  Surgeon: Margaretha Sheffield, MD;  Location: [place];  Service: ENT;  Laterality: Bilateral;    OB History     Gravida  3   Para  2   Term  2   Preterm  0   AB  0   Living  1      SAB  0   IAB  0   Ectopic  0   Multiple  0   Live Births  1            Home Medications    Prior to Admission medications   Medication Sig Start Date End Date Taking? Authorizing Provider  acetaminophen (TYLENOL) 500 MG tablet Take 500 mg by mouth every 6 (six) hours as needed.   Yes [provider]  amoxicillin-clavulanate (AUGMENTIN) 875-125 MG tablet Take 1 tablet by mouth 2 (two) times daily. 08/21/22  Yes [provider]  cetirizine (ZYRTEC) 10 MG tablet Take 10  mg by mouth daily.   Yes [provider]  Cholecalciferol (VITAMIN D3) 1.25 MG (50000 UT) CAPS Take by mouth.   Yes [provider]  ciprofloxacin (CILOXAN) 0.3 % ophthalmic solution  08/21/22  Yes [provider]  Cranberry 600 MG TABS Take by mouth. Pt is taking 650 mg daily   Yes [provider]  fluticasone (FLONASE) 50 MCG/ACT nasal spray Place 2 sprays into both nostrils daily. 09/10/17  Yes Donnamae Jude, MD  Glucosamine-Chondroit-Vit C-Mn (GLUCOSAMINE 1500 COMPLEX) CAPS Take by mouth.   Yes [provider]  ibuprofen (ADVIL) 400 MG tablet Take 400 mg by mouth every 6 (six) hours as needed.   Yes [provider]  levothyroxine (SYNTHROID) 50 MCG tablet Take 1 tablet (50 mcg total) by  mouth daily before breakfast. 08/10/22  Yes Donnamae Jude, MD  Magnesium 500 MG CAPS Take by mouth.   Yes [provider]  melatonin 5 MG TABS Take 5 mg by mouth.   Yes [provider]  montelukast (SINGULAIR) 10 MG tablet Take 1 tablet (10 mg total) by mouth at bedtime. 02/14/17  Yes Donnamae Jude, MD  Multiple Vitamin (MULTIVITAMIN) tablet Take 1 tablet by mouth daily.   Yes [provider]  Omega-3 Fatty Acids (FISH OIL) 1360 MG CAPS Take by mouth. Pt is taking 1400 mg daily   Yes [provider]  ondansetron (ZOFRAN) 4 MG tablet Take 1 tablet (4 mg total) by mouth every 8 (eight) hours as needed for nausea or vomiting. 05/01/22  Yes White, Adrienne R, NP  pseudoephedrine (SUDAFED) 30 MG tablet Take 30 mg by mouth every 6 (six) hours as needed for congestion.   Yes [provider]  azithromycin (ZITHROMAX) 250 MG tablet Take 1 tablet (250 mg total) by mouth daily. Take first 2 tablets together, then 1 every day until finished. 05/01/22   Hans Eden, NP    Family History Family History  Problem Relation Age of Onset   Stroke Father    Hypertension Father    Thyroid disease Father    Thyroid disease Brother    Thyroid disease Sister    Breast cancer Neg Hx     Social History Social History   Tobacco Use   Smoking status: Never   Smokeless tobacco: Never  Vaping Use   Vaping Use: Never used  Substance Use Topics   Alcohol use: Yes    Alcohol/week: 4.0 standard drinks of alcohol    Types: 4 Glasses of wine per week    Comment: ocssonaly   Drug use: No     Allergies   Scallops [shellfish allergy]   Review of Systems Review of Systems  Musculoskeletal:  Positive for arthralgias and joint swelling.  Skin:  Negative for color change.  Neurological:  Negative for weakness and numbness.  Hematological: Negative.   Psychiatric/Behavioral: Negative.       Physical Exam Triage Vital Signs ED Triage Vitals [08/24/22 1014]   Enc Vitals Group     BP      Pulse      Resp      Temp      Temp src      SpO2      Weight 157 lb (71.2 kg)     Height '5\' 8"'$  (1.727 m)     Head Circumference      Peak Flow      Pain Score 4     Pain Loc  Pain Edu?      Excl. in [place]?    No data found.  Updated Vital Signs BP (!) 135/95 (BP Location: Left Arm)   Pulse 88   Temp 98.3 F (36.8 C) (Oral)   Ht '5\' 8"'$  (1.727 m)   Wt 157 lb (71.2 kg)   LMP 03/04/2015   SpO2 98%   BMI 23.87 kg/m   Visual Acuity Right Eye Distance:   Left Eye Distance:   Bilateral Distance:    Right Eye Near:   Left Eye Near:    Bilateral Near:     Physical Exam Vitals and nursing note reviewed.  Constitutional:      Appearance: Normal appearance. She is not ill-appearing.  HENT:     Head: Normocephalic and atraumatic.  Musculoskeletal:        General: Swelling, tenderness and signs of injury present. No deformity. Normal range of motion.  Skin:    General: Skin is warm and dry.     Capillary Refill: Capillary refill takes less than 2 seconds.     Findings: No bruising or erythema.  Neurological:     General: No focal deficit present.     Mental Status: She is alert and oriented to person, place, and time.  Psychiatric:        Mood and Affect: Mood normal.        Behavior: Behavior normal.        Thought Content: Thought content normal.        Judgment: Judgment normal.      UC Treatments / Results  Labs (all labs ordered are listed, but only abnormal results are displayed) Labs Reviewed - No data to display  EKG   Radiology DG Ankle Complete Left  Result Date: 08/24/2022 CLINICAL DATA:  Pain and swelling EXAM: LEFT ANKLE COMPLETE - 3+ VIEW COMPARISON:  None Available. FINDINGS: Nondisplaced fracture of the inferior most aspect of the distal fibula with overlying soft tissue swelling. Old avulsion fracture of the tip of the medial malleolus. No aggressive osseous lesion. Normal alignment. No radiopaque foreign body  or soft tissue emphysema. IMPRESSION: 1. Nondisplaced fracture of the inferior most aspect of the distal fibula with overlying soft tissue swelling. Electronically Signed   By: Kathreen Devoid M.D.   On: 08/24/2022 10:38    Procedures Procedures (including critical care time)  Medications Ordered in UC Medications - No data to display  Initial Impression / Assessment and Plan / UC Course  I have reviewed the triage vital signs and the nursing notes.  Pertinent labs & imaging results that were available during my care of the patient were reviewed by me and considered in my medical decision making (see chart for details).   Patient is a very pleasant, nontoxic-appearing 56 year old female here for evaluation of pain and swelling to the outside of her left ankle that began yesterday after she rolled her ankle stepping off of a track in her son's football practice.      Patient has full range of motion sensation in her toes.  There is no pain with palpation of the metatarsal or tarsal bones.  No pain with compression of the base of the fifth metatarsal, palpation of the calcaneus, palpation of the arch, or palpation of the Achilles tendon.  No pain with compression of medial or lateral malleolus.  Patient does have tenderness and swelling over the talofibular ligaments with mild tenderness.  No ecchymosis or erythema noted.  The swelling is more prominent when  compared to the opposite ankle.  I suspect patient has sprained her ankle but I will obtain a radiograph to rule out bony abnormality.  Left ankle x-ray independent reviewed and evaluated by me.  Impression: There is a nondisplaced fracture of the distal aspect of the fibula with surrounding soft tissue swelling that are best visualized in the oblique and AP views.  Radiology overread is pending. Radiology impression states there is a nondisplaced fracture of the inferior most aspect of the distal fibula with overlying soft tissue swelling.  I  will discharge patient home with a diagnosis of fibular fracture and placed her in a cam walking boot.  I will make a referral to orthopedics for follow-up and evaluation.   Final Clinical Impressions(s) / UC Diagnoses   Final diagnoses:  Closed nondisplaced fracture of lateral malleolus of left fibula, initial encounter     Discharge Instructions      Your x-rays show that you broke a small piece of the distal aspect of your fibula.  The fragment is still attached and it is not displaced.  Wear the cam walker boot when you are up and walking to provide support and prevent further injury.  You may take it off when you go to bed at night and to bathe.  Keep your left foot elevated is much as possible to decrease swelling and aid in pain relief.  Take over-the-counter Tylenol and ibuprofen according to package instructions as needed for pain.  I made a referral to orthopedics for further evaluation and management of your fibular fracture.       ED Prescriptions   None    PDMP not reviewed this encounter.   Margarette Canada, NP 08/24/22 1044

## 2022-08-24 NOTE — Discharge Instructions (Addendum)
Your x-rays show that you broke a small piece of the distal aspect of your fibula.  The fragment is still attached and it is not displaced.  Wear the cam walker boot when you are up and walking to provide support and prevent further injury.  You may take it off when you go to bed at night and to bathe.  Keep your left foot elevated is much as possible to decrease swelling and aid in pain relief.  Take over-the-counter Tylenol and ibuprofen according to package instructions as needed for pain.  I made a referral to orthopedics for further evaluation and management of your fibular fracture.

## 2022-12-14 ENCOUNTER — Other Ambulatory Visit: Payer: Self-pay | Admitting: Family Medicine

## 2022-12-14 DIAGNOSIS — Z1231 Encounter for screening mammogram for malignant neoplasm of breast: Secondary | ICD-10-CM

## 2023-01-10 ENCOUNTER — Ambulatory Visit
Admission: RE | Admit: 2023-01-10 | Discharge: 2023-01-10 | Disposition: A | Discharge status: Home / Self Care | Payer: BC Managed Care – PPO | Source: Ambulatory Visit | Attending: Family Medicine | Admitting: Family Medicine

## 2023-01-10 DIAGNOSIS — Z1231 Encounter for screening mammogram for malignant neoplasm of breast: Secondary | ICD-10-CM | POA: Insufficient documentation

## 2023-01-31 DIAGNOSIS — D508 Other iron deficiency anemias: Secondary | ICD-10-CM | POA: Insufficient documentation

## 2023-01-31 DIAGNOSIS — K219 Gastro-esophageal reflux disease without esophagitis: Secondary | ICD-10-CM | POA: Insufficient documentation

## 2023-03-07 ENCOUNTER — Encounter: Payer: Self-pay | Admitting: Family Medicine

## 2023-03-07 ENCOUNTER — Other Ambulatory Visit (HOSPITAL_COMMUNITY)
Admission: RE | Admit: 2023-03-07 | Discharge: 2023-03-07 | Disposition: A | Discharge status: Home / Self Care | Payer: BC Managed Care – PPO | Source: Ambulatory Visit | Attending: Family Medicine | Admitting: Family Medicine

## 2023-03-07 ENCOUNTER — Ambulatory Visit (INDEPENDENT_AMBULATORY_CARE_PROVIDER_SITE_OTHER): Payer: BC Managed Care – PPO | Admitting: Family Medicine

## 2023-03-07 VITALS — BP 132/80 | HR 89 | Wt 156.0 lb

## 2023-03-07 DIAGNOSIS — Z01419 Encounter for gynecological examination (general) (routine) without abnormal findings: Secondary | ICD-10-CM

## 2023-03-07 DIAGNOSIS — Z124 Encounter for screening for malignant neoplasm of cervix: Secondary | ICD-10-CM | POA: Insufficient documentation

## 2023-03-07 DIAGNOSIS — Z01411 Encounter for gynecological examination (general) (routine) with abnormal findings: Secondary | ICD-10-CM

## 2023-03-07 NOTE — Progress Notes (Addendum)
Subjective:     Rebecca Roman is a 57 y.o. female and is here for a comprehensive physical exam. The patient reports no problems. No longer having hot flashes.  The following portions of the patient's history were reviewed and updated as appropriate: allergies, current medications, past family history, past medical history, past social history, past surgical history, and problem list.  Review of Systems Pertinent items noted in HPI and remainder of comprehensive ROS otherwise negative.   Objective:  Chaperone present for exam   BP 132/80   Pulse 89   Wt 156 lb (70.8 kg)   LMP 03/04/2015   BMI 23.72 kg/m  General appearance: alert, cooperative, and appears stated age Head: Normocephalic, without obvious abnormality, atraumatic Neck: no adenopathy, supple, symmetrical, trachea midline, and thyroid not enlarged, symmetric, no tenderness/mass/nodules Lungs: clear to auscultation bilaterally Breasts: normal appearance, no masses or tenderness Heart: regular rate and rhythm, S1, S2 normal, no murmur, click, rub or gallop Abdomen: soft, non-tender; bowel sounds normal; no masses,  no organomegaly Pelvic: cervix normal in appearance, external genitalia normal, no adnexal masses or tenderness, no cervical motion tenderness, uterus normal size, shape, and consistency, and vagina normal without discharge Extremities: extremities normal, atraumatic, no cyanosis or edema Pulses: 2+ and symmetric Skin: Skin color, texture, turgor normal. No rashes or lesions Lymph nodes: Cervical, supraclavicular, and axillary nodes normal. Neurologic: Grossly normal    Assessment:    GYN female exam.      Plan:  Screening for malignant neoplasm of cervix - Plan: Cytology - PAP  Encounter for gynecological examination with abnormal finding - mammogram in January of this year. Has PCP.  Return in 1 year (on 03/06/2024).    See After Visit Summary for Counseling Recommendations

## 2023-03-07 NOTE — Progress Notes (Signed)
CC: Annual  Last Pap: 2023 Last Mammo: 2024    Refill of Thyroid medication

## 2023-03-07 NOTE — Patient Instructions (Signed)

## 2023-03-12 LAB — CYTOLOGY - PAP
Comment: NEGATIVE
High risk HPV: NEGATIVE

## 2023-03-14 ENCOUNTER — Telehealth: Payer: Self-pay | Admitting: *Deleted

## 2023-03-14 NOTE — Telephone Encounter (Signed)
Pt informed of pap results.  

## 2023-10-10 ENCOUNTER — Other Ambulatory Visit: Payer: Self-pay | Admitting: *Deleted

## 2023-10-10 DIAGNOSIS — E039 Hypothyroidism, unspecified: Secondary | ICD-10-CM

## 2023-10-10 MED ORDER — LEVOTHYROXINE SODIUM 50 MCG PO TABS: 50.0000 ug | ORAL_TABLET | Freq: Every day | ORAL | 4 refills | Status: AC

## 2023-12-19 ENCOUNTER — Other Ambulatory Visit: Payer: Self-pay | Admitting: Family Medicine

## 2023-12-19 DIAGNOSIS — Z1231 Encounter for screening mammogram for malignant neoplasm of breast: Secondary | ICD-10-CM

## 2024-01-16 ENCOUNTER — Ambulatory Visit
Admission: RE | Admit: 2024-01-16 | Discharge: 2024-01-16 | Disposition: A | Discharge status: Home / Self Care | Payer: BC Managed Care – PPO | Source: Ambulatory Visit | Attending: Family Medicine | Admitting: Family Medicine

## 2024-01-16 DIAGNOSIS — Z1231 Encounter for screening mammogram for malignant neoplasm of breast: Secondary | ICD-10-CM | POA: Insufficient documentation

## 2024-01-18 ENCOUNTER — Encounter: Payer: Self-pay | Admitting: Family Medicine

## 2024-04-09 ENCOUNTER — Other Ambulatory Visit (HOSPITAL_COMMUNITY)
Admission: RE | Admit: 2024-04-09 | Discharge: 2024-04-09 | Disposition: A | Discharge status: Home / Self Care | Source: Ambulatory Visit | Attending: Family Medicine | Admitting: Family Medicine

## 2024-04-09 ENCOUNTER — Ambulatory Visit: Admitting: Family Medicine

## 2024-04-09 ENCOUNTER — Encounter: Payer: Self-pay | Admitting: Family Medicine

## 2024-04-09 VITALS — BP 137/84 | HR 89 | Wt 157.0 lb

## 2024-04-09 DIAGNOSIS — Z124 Encounter for screening for malignant neoplasm of cervix: Secondary | ICD-10-CM | POA: Insufficient documentation

## 2024-04-09 DIAGNOSIS — Z01419 Encounter for gynecological examination (general) (routine) without abnormal findings: Secondary | ICD-10-CM | POA: Diagnosis not present

## 2024-04-09 NOTE — Progress Notes (Signed)
 Patient presents for Annual.  LMP: Patient's last menstrual period was 03/04/2015.  Last pap: Date: 2024 Contraception: Post-menopausal Mammogram: Up to date: 2025 STD Screening: Declines Flu Vaccine : N/A  CC:  Annual

## 2024-04-09 NOTE — Progress Notes (Signed)
 Subjective:     Rebecca Roman is a 58 y.o. female and is here for a comprehensive physical exam. The patient reports no problems. Having some issues with new onset anemia. She is having an upper and lower GI this week.   The following portions of the patient's history were reviewed and updated as appropriate: allergies, current medications, past family history, past medical history, past social history, past surgical history, and problem list.  Review of Systems Pertinent items noted in HPI and remainder of comprehensive ROS otherwise negative.   Objective:  Chaperone present for exam   BP 137/84   Pulse 89   Wt 157 lb (71.2 kg)   LMP 03/04/2015   BMI 23.87 kg/m  General appearance: alert, cooperative, and appears stated age Head: Normocephalic, without obvious abnormality, atraumatic Neck: no adenopathy, supple, symmetrical, trachea midline, and thyroid not enlarged, symmetric, no tenderness/mass/nodules Lungs: clear to auscultation bilaterally Heart: regular rate and rhythm, S1, S2 normal, no murmur, click, rub or gallop Abdomen: soft, non-tender; bowel sounds normal; no masses,  no organomegaly Pelvic: cervix normal in appearance, external genitalia normal, no adnexal masses or tenderness, no cervical motion tenderness, uterus normal size, shape, and consistency, and vagina normal without discharge Extremities: extremities normal, atraumatic, no cyanosis or edema Pulses: 2+ and symmetric Skin: Skin color, texture, turgor normal. No rashes or lesions Lymph nodes: Cervical, supraclavicular, and axillary nodes normal. Neurologic: Grossly normal    Assessment:    Healthy female exam.      Plan:  Encounter for gynecological examination without abnormal finding  Screening for malignant neoplasm of cervix - Plan: Cytology - PAP    See After Visit Summary for Counseling Recommendations

## 2024-04-11 ENCOUNTER — Ambulatory Visit: Payer: Self-pay

## 2024-04-11 DIAGNOSIS — K5730 Diverticulosis of large intestine without perforation or abscess without bleeding: Secondary | ICD-10-CM | POA: Diagnosis not present

## 2024-04-11 DIAGNOSIS — K642 Third degree hemorrhoids: Secondary | ICD-10-CM | POA: Diagnosis not present

## 2024-04-11 DIAGNOSIS — D509 Iron deficiency anemia, unspecified: Secondary | ICD-10-CM

## 2024-04-11 DIAGNOSIS — K2950 Unspecified chronic gastritis without bleeding: Secondary | ICD-10-CM | POA: Diagnosis not present

## 2024-04-11 DIAGNOSIS — B3781 Candidal esophagitis: Secondary | ICD-10-CM

## 2024-04-13 LAB — CYTOLOGY - PAP
Comment: NEGATIVE
Diagnosis: NEGATIVE
High risk HPV: NEGATIVE

## 2024-04-14 ENCOUNTER — Encounter: Payer: Self-pay | Admitting: Family Medicine

## 2024-12-15 ENCOUNTER — Other Ambulatory Visit: Payer: Self-pay | Admitting: Family Medicine

## 2024-12-15 DIAGNOSIS — Z1231 Encounter for screening mammogram for malignant neoplasm of breast: Secondary | ICD-10-CM

## 2025-01-04 ENCOUNTER — Other Ambulatory Visit: Payer: Self-pay | Admitting: Family Medicine

## 2025-01-04 DIAGNOSIS — E039 Hypothyroidism, unspecified: Secondary | ICD-10-CM

## 2025-01-13 ENCOUNTER — Encounter: Payer: Self-pay | Admitting: Family Medicine

## 2025-01-20 ENCOUNTER — Ambulatory Visit
